# Patient Record
Sex: Male | Born: 1950 | Race: White | Hispanic: No | Smoking: Never smoker
Health system: Southern US, Community
[De-identification: ages and names within clinical notes are randomized; demographics above are authoritative.]

## PROBLEM LIST (undated history)

## (undated) DIAGNOSIS — T4145XA Adverse effect of unspecified anesthetic, initial encounter: Secondary | ICD-10-CM

## (undated) DIAGNOSIS — M1990 Unspecified osteoarthritis, unspecified site: Secondary | ICD-10-CM

## (undated) DIAGNOSIS — L570 Actinic keratosis: Secondary | ICD-10-CM

## (undated) DIAGNOSIS — T8859XA Other complications of anesthesia, initial encounter: Secondary | ICD-10-CM

## (undated) DIAGNOSIS — I739 Peripheral vascular disease, unspecified: Secondary | ICD-10-CM

## (undated) DIAGNOSIS — R809 Proteinuria, unspecified: Secondary | ICD-10-CM

## (undated) DIAGNOSIS — I10 Essential (primary) hypertension: Secondary | ICD-10-CM

## (undated) DIAGNOSIS — E119 Type 2 diabetes mellitus without complications: Secondary | ICD-10-CM

## (undated) DIAGNOSIS — N189 Chronic kidney disease, unspecified: Secondary | ICD-10-CM

## (undated) HISTORY — PX: UVULECTOMY: SHX2631

## (undated) HISTORY — PX: ROTATOR CUFF REPAIR: SHX139

## (undated) HISTORY — PX: PILONIDAL CYST EXCISION: SHX744

## (undated) HISTORY — PX: KNEE ARTHROSCOPY: SUR90

## (undated) HISTORY — PX: VEIN SURGERY: SHX48

## (undated) HISTORY — DX: Actinic keratosis: L57.0

## (undated) HISTORY — PX: TONSILLECTOMY: SUR1361

---
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
## 1988-12-13 HISTORY — PX: VASECTOMY: SHX75

## 1990-12-13 HISTORY — PX: VARICOSE VEIN SURGERY: SHX832

## 2004-10-24 ENCOUNTER — Emergency Department: Payer: Self-pay | Admitting: Emergency Medicine

## 2008-01-09 ENCOUNTER — Ambulatory Visit: Payer: Self-pay

## 2008-01-23 ENCOUNTER — Ambulatory Visit: Payer: Self-pay | Admitting: Orthopaedic Surgery

## 2008-01-23 ENCOUNTER — Other Ambulatory Visit: Payer: Self-pay

## 2008-01-30 ENCOUNTER — Ambulatory Visit: Payer: Self-pay | Admitting: Orthopaedic Surgery

## 2012-12-21 ENCOUNTER — Ambulatory Visit: Payer: Self-pay | Admitting: General Practice

## 2013-01-26 ENCOUNTER — Ambulatory Visit: Payer: Self-pay | Admitting: Unknown Physician Specialty

## 2013-01-29 ENCOUNTER — Ambulatory Visit: Payer: Self-pay | Admitting: Anesthesiology

## 2013-01-31 ENCOUNTER — Ambulatory Visit: Payer: Self-pay | Admitting: General Practice

## 2013-03-01 ENCOUNTER — Ambulatory Visit: Payer: Self-pay | Admitting: Internal Medicine

## 2015-04-04 NOTE — Op Note (Signed)
PATIENT NAME:  Lawrence Patterson, Lawrence Patterson MR#:  [id number] DATE OF BIRTH:  15-Feb-1951  DATE OF PROCEDURE:  01/31/2013  PREOPERATIVE DIAGNOSIS:  Internal derangement of the left knee.  POSTOPERATIVE DIAGNOSES: 1.  Tear of the posterior horn medial meniscus, left knee.  2.  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   PROCEDURES PERFORMED:  Left knee arthroscopy, partial medial meniscectomy and medial chondroplasty.   SURGEON:  Dr. Laurice Record. Hooten.   ANESTHESIA:  General.   ESTIMATED BLOOD LOSS:  Minimal.   FLUIDS REPLACED:  1000 mL of crystalloid.   DRAINS:  None.   INDICATIONS FOR SURGERY:  The patient is a 64 year old gentleman who has been seen for complaints of left knee pain.  MRI demonstrated findings suggestive of meniscal pathology.  After discussion of the risks and benefits of surgical intervention, the patient expressed understanding of the risks and benefits and agreed with plans for surgical intervention.   PROCEDURE IN DETAIL:  The patient was brought to the operating room, and after adequate general anesthesia was achieved, a tourniquet was placed on the patient's left thigh and leg was placed in a leg holder.  All bony prominences were well padded.  The patient's left knee and leg were cleaned and prepped with alcohol and DuraPrep draped in the usual sterile fashion.  A "timeout" was performed as per usual protocol.  The anticipated portal sites were injected with 0.25% Marcaine with epinephrine.  An anterolateral portal was created and a cannula was inserted.  Mild effusion was evacuated.  The scope was inserted and the knee was distended with fluid using the Stryker pump.  The scope was advanced down the medial gutter and into the medial compartment of the knee.  Under visualization with the scope, an anteromedial portal was created and hook probe was inserted.  Inspection of the medial compartment demonstrated a tear along the posterior horn of the medial meniscus.   This was debrided using meniscal punches and a 4.5 mm shaver.  Final contouring was performed using a 50 degree ArthroCare wand.  Good restoration of contour was appreciated.  The remaining rim of meniscus was visualized and probed and felt to be stable.  Inspection of the articular surface showed the tibial plateau to be in good condition.  There was an area along the lateral aspect of the medial femoral condyle with fissuring of the articular cartilage and lifting off of a portion of the articular cartilage.  These areas were debrided and contoured using the 50 degree ArthroCare wand.  Scope was then advanced into the intercondylar region.  Anterior cruciate ligament was visualized and probed and felt to stable.  Scope was removed from the anterolateral portal and reinserted via the anteromedial portal so as to better visualize the lateral compartment.  The lateral meniscus was visualized and probed and felt to be stable.  Only mild fraying of the innermost margin was appreciated and this was debrided using the ArthroCare wand.  The articular surface was visualized and probed and felt to stable.  Finally, the scope was positioned so as to visualize the patellofemoral articulation.  Good patellar tracking was noted.  There was a question of injury to the quadriceps tendon attachment on MRI.  Scope was positioned so as to visualize the quadriceps tendon especially along the superior pole.  No gross injury was appreciated.  The wound was irrigated with copious amounts of fluid and then suctioned dry.  The anterolateral portal was reapproximated using #3-0 nylon.  A combination  of 0.25% Marcaine with epinephrine and 4 mg of morphine was injected via the scope.  The scope was removed and the anteromedial portal was reapproximated using #3-0 nylon.  A sterile dressing was applied followed by application of ice wrap.    The patient tolerated the procedure well.  He was transported to the recovery room in stable  condition.     ____________________________ Laurice Record. Holley Bouche., MD jph:ea D: 01/31/2013 22:21:08 ET T: 02/01/2013 06:06:07 ET JOB#: [id number]  cc: Laurice Record. Holley Bouche., MD, <Dictator> JAMES P Holley Bouche MD ELECTRONICALLY SIGNED 02/04/2013 15:12

## 2016-07-16 ENCOUNTER — Other Ambulatory Visit: Payer: Self-pay | Admitting: Nephrology

## 2016-07-16 ENCOUNTER — Other Ambulatory Visit (HOSPITAL_COMMUNITY): Payer: Self-pay | Admitting: Nephrology

## 2016-07-16 DIAGNOSIS — S35411S Laceration of right renal artery, sequela: Secondary | ICD-10-CM

## 2016-07-29 ENCOUNTER — Other Ambulatory Visit: Payer: Self-pay | Admitting: Nephrology

## 2016-07-29 DIAGNOSIS — N2889 Other specified disorders of kidney and ureter: Secondary | ICD-10-CM

## 2016-07-30 ENCOUNTER — Ambulatory Visit
Admission: RE | Admit: 2016-07-30 | Discharge: 2016-07-30 | Disposition: A | Discharge status: Home / Self Care | Payer: BLUE CROSS/BLUE SHIELD | Source: Ambulatory Visit | Attending: Nephrology | Admitting: Nephrology

## 2016-07-30 DIAGNOSIS — N2889 Other specified disorders of kidney and ureter: Secondary | ICD-10-CM | POA: Diagnosis present

## 2016-10-11 DIAGNOSIS — M1A00X0 Idiopathic chronic gout, unspecified site, without tophus (tophi): Secondary | ICD-10-CM | POA: Insufficient documentation

## 2018-05-23 DIAGNOSIS — E1169 Type 2 diabetes mellitus with other specified complication: Secondary | ICD-10-CM | POA: Insufficient documentation

## 2018-09-14 ENCOUNTER — Other Ambulatory Visit: Payer: Self-pay | Admitting: Nephrology

## 2018-09-14 DIAGNOSIS — R809 Proteinuria, unspecified: Secondary | ICD-10-CM

## 2018-09-18 ENCOUNTER — Encounter
Admission: RE | Admit: 2018-09-18 | Discharge: 2018-09-18 | Disposition: A | Discharge status: Home / Self Care | Payer: Medicare Other | Source: Ambulatory Visit | Attending: Nephrology | Admitting: Nephrology

## 2018-09-18 ENCOUNTER — Other Ambulatory Visit: Payer: Self-pay | Admitting: Student

## 2018-09-18 DIAGNOSIS — Z01818 Encounter for other preprocedural examination: Secondary | ICD-10-CM

## 2018-09-18 DIAGNOSIS — Z7982 Long term (current) use of aspirin: Secondary | ICD-10-CM | POA: Diagnosis not present

## 2018-09-18 DIAGNOSIS — E1151 Type 2 diabetes mellitus with diabetic peripheral angiopathy without gangrene: Secondary | ICD-10-CM | POA: Diagnosis not present

## 2018-09-18 DIAGNOSIS — Z7984 Long term (current) use of oral hypoglycemic drugs: Secondary | ICD-10-CM | POA: Diagnosis not present

## 2018-09-18 DIAGNOSIS — Z9889 Other specified postprocedural states: Secondary | ICD-10-CM | POA: Diagnosis not present

## 2018-09-18 DIAGNOSIS — R809 Proteinuria, unspecified: Secondary | ICD-10-CM

## 2018-09-18 DIAGNOSIS — Z79899 Other long term (current) drug therapy: Secondary | ICD-10-CM | POA: Diagnosis not present

## 2018-09-18 DIAGNOSIS — I10 Essential (primary) hypertension: Secondary | ICD-10-CM | POA: Diagnosis not present

## 2018-09-18 LAB — DIFFERENTIAL
Basophils Absolute: 0.1 10*3/uL (ref 0–0.1)
Basophils Relative: 1 %
Eosinophils Absolute: 0.1 10*3/uL (ref 0–0.7)
Eosinophils Relative: 1 %
Lymphocytes Relative: 22 %
Lymphs Abs: 1.5 10*3/uL (ref 1.0–3.6)
MONO ABS: 0.6 10*3/uL (ref 0.2–1.0)
MONOS PCT: 9 %
NEUTROS ABS: 4.5 10*3/uL (ref 1.4–6.5)
Neutrophils Relative %: 67 %

## 2018-09-18 LAB — COMPREHENSIVE METABOLIC PANEL
ALT: 19 U/L (ref 0–44)
AST: 20 U/L (ref 15–41)
Albumin: 3.8 g/dL (ref 3.5–5.0)
Alkaline Phosphatase: 44 U/L (ref 38–126)
Anion gap: 6 (ref 5–15)
BUN: 29 mg/dL — AB (ref 8–23)
CHLORIDE: 107 mmol/L (ref 98–111)
CO2: 26 mmol/L (ref 22–32)
CREATININE: 1.14 mg/dL (ref 0.61–1.24)
Calcium: 9.1 mg/dL (ref 8.9–10.3)
GFR calc Af Amer: >60 mL/min (ref >60)
GFR calc non Af Amer: >60 mL/min (ref >60)
Glucose, Bld: 137 mg/dL — ABNORMAL HIGH (ref 70–99)
Potassium: 4.8 mmol/L (ref 3.5–5.1)
SODIUM: 139 mmol/L (ref 135–145)
Total Bilirubin: 0.6 mg/dL (ref 0.3–1.2)
Total Protein: 7.2 g/dL (ref 6.5–8.1)

## 2018-09-18 LAB — URINALYSIS, ROUTINE W REFLEX MICROSCOPIC
Bacteria, UA: NONE SEEN
Bilirubin Urine: NEGATIVE
GLUCOSE, UA: NEGATIVE mg/dL
Hgb urine dipstick: NEGATIVE
Ketones, ur: NEGATIVE mg/dL
Leukocytes, UA: NEGATIVE
NITRITE: NEGATIVE
PROTEIN: 100 mg/dL — AB
Specific Gravity, Urine: 1.024 (ref 1.005–1.030)
Squamous Epithelial / LPF: NONE SEEN (ref 0–5)
pH: 5 (ref 5.0–8.0)

## 2018-09-18 LAB — CBC
HCT: 41.5 % (ref 40.0–52.0)
Hemoglobin: 13.7 g/dL (ref 13.0–18.0)
MCH: 32.9 pg (ref 26.0–34.0)
MCHC: 33.1 g/dL (ref 32.0–36.0)
MCV: 99.5 fL (ref 80.0–100.0)
Platelets: 207 10*3/uL (ref 150–440)
RBC: 4.16 MIL/uL — ABNORMAL LOW (ref 4.40–5.90)
RDW: 14 % (ref 11.5–14.5)
WBC: 6.8 10*3/uL (ref 3.8–10.6)

## 2018-09-18 LAB — TYPE AND SCREEN
ABO/RH(D): O POS
Antibody Screen: NEGATIVE

## 2018-09-18 LAB — PROTIME-INR
INR: 0.95
Prothrombin Time: 12.6 seconds (ref 11.4–15.2)

## 2018-09-18 LAB — PROTEIN / CREATININE RATIO, URINE
Creatinine, Urine: 156 mg/dL
PROTEIN CREATININE RATIO: 1.87 mg/mg{creat} — AB (ref 0.00–0.15)
Total Protein, Urine: 291 mg/dL

## 2018-09-19 ENCOUNTER — Ambulatory Visit
Admission: RE | Admit: 2018-09-19 | Discharge: 2018-09-19 | Disposition: A | Discharge status: Home / Self Care | Payer: Medicare Other | Source: Ambulatory Visit | Attending: Nephrology | Admitting: Nephrology

## 2018-09-19 DIAGNOSIS — Z9889 Other specified postprocedural states: Secondary | ICD-10-CM | POA: Insufficient documentation

## 2018-09-19 DIAGNOSIS — I10 Essential (primary) hypertension: Secondary | ICD-10-CM | POA: Insufficient documentation

## 2018-09-19 DIAGNOSIS — Z79899 Other long term (current) drug therapy: Secondary | ICD-10-CM | POA: Insufficient documentation

## 2018-09-19 DIAGNOSIS — E1151 Type 2 diabetes mellitus with diabetic peripheral angiopathy without gangrene: Secondary | ICD-10-CM | POA: Insufficient documentation

## 2018-09-19 DIAGNOSIS — R809 Proteinuria, unspecified: Secondary | ICD-10-CM | POA: Diagnosis not present

## 2018-09-19 DIAGNOSIS — Z7984 Long term (current) use of oral hypoglycemic drugs: Secondary | ICD-10-CM | POA: Insufficient documentation

## 2018-09-19 DIAGNOSIS — Z7982 Long term (current) use of aspirin: Secondary | ICD-10-CM | POA: Insufficient documentation

## 2018-09-19 HISTORY — DX: Essential (primary) hypertension: I10

## 2018-09-19 HISTORY — DX: Peripheral vascular disease, unspecified: I73.9

## 2018-09-19 HISTORY — DX: Type 2 diabetes mellitus without complications: E11.9

## 2018-09-19 HISTORY — DX: Proteinuria, unspecified: R80.9

## 2018-09-19 LAB — GLUCOSE, CAPILLARY: Glucose-Capillary: 142 mg/dL — ABNORMAL HIGH (ref 70–99)

## 2018-09-19 MED ORDER — FENTANYL CITRATE (PF) 100 MCG/2ML IJ SOLN
INTRAMUSCULAR | Status: AC | PRN
  Administered 2018-09-19 (×2): 50 ug via INTRAVENOUS

## 2018-09-19 MED ORDER — FENTANYL CITRATE (PF) 100 MCG/2ML IJ SOLN
INTRAMUSCULAR | Status: AC
  Filled 2018-09-19: qty 4

## 2018-09-19 MED ORDER — SODIUM CHLORIDE 0.9 % IV SOLN
INTRAVENOUS | Status: DC
  Administered 2018-09-19: 10:00:00 via INTRAVENOUS

## 2018-09-19 MED ORDER — MIDAZOLAM HCL 2 MG/2ML IJ SOLN
INTRAMUSCULAR | Status: AC | PRN
  Administered 2018-09-19 (×2): 1 mg via INTRAVENOUS

## 2018-09-19 MED ORDER — MIDAZOLAM HCL 2 MG/2ML IJ SOLN
INTRAMUSCULAR | Status: AC
  Filled 2018-09-19: qty 4

## 2018-09-19 NOTE — Discharge Instructions (Signed)
Percutaneous Kidney Biopsy °A kidney biopsy is a procedure to remove small pieces of tissue from a kidney. In a percutaneous biopsy, the tissue is removed using a needle that is inserted through the skin. This procedure is done so that the tissue can be examined under a microscope and checked for disease or infection. °Tell a health care provider about: °· Any allergies you have. °· All medicines you are taking, including vitamins, herbs, eye drops, creams, and over-the-counter medicines. °· Any problems you or family members have had with anesthetic medicines. °· Any blood disorders you have. °· Any surgeries you have had. °· Any medical conditions you have. °· Whether you are pregnant or may be pregnant. °What are the risks? °Generally, this is a safe procedure. However, problems may occur, including: °· Infection. °· Bleeding. °· Allergic reactions to medicines. °· Damage to other structures or organs. °· Swelling from a collection of clotted blood outside a blood vessel (hematoma). °· Blood in the urine (hematuria). ° °What happens before the procedure? °· Follow instructions from your health care provider about eating or drinking restrictions. °· Ask your health care provider about: °? Changing or stopping your regular medicines. This is especially important if you are taking diabetes medicines or blood thinners. °? Taking medicines such as aspirin and ibuprofen. These medicines can thin your blood. Do not take these medicines before your procedure if your health care provider instructs you not to. °· You may be given antibiotic medicine to help prevent infection. °· You will have blood and urine samples taken. This is to make sure that you do not have a condition where you should not have a biopsy. °· Plan to have someone take you home from the hospital or clinic. °· Ask your health care provider how your biopsy site will be marked or identified. °What happens during the procedure? °· To lower your risk of  infection: °? Your health care team will wash or sanitize their hands. °? Your skin will be washed with soap. °· An IV tube will be inserted into one of your veins. °· You will be given one or more of the following: °? A medicine to help you relax (sedative). °? A medicine to numb the area (local anesthetic). °· You will lie on your abdomen. A firm pillow will be placed under your body to help push the kidneys closer to the surface of the skin. If you have a transplanted kidney, you will lie on your back. °· The health care provider will mark the area where the needle will enter your skin. °· An imaging test--such as an ultrasound, X-ray, CT scan, or MRI--will be used to locate the kidney. These images will also help the health care provider to guide the biopsy needle into the kidney. °· You will be asked to hold your breath and stay still while the health care provider inserts the needle and removes the kidney tissue. °? You will need to hold your breath and stay still for 30-45 seconds. °? During the biopsy, you may hear a popping sound from the needle. °? You may also feel some pressure from the area where the needle is being inserted. °· The needle may be inserted and removed 3 or 4 times to make sure that enough tissue is taken for testing. °· A bandage (dressing) may be placed over the spot where the needle entered your skin (biopsy site). °The procedure may vary among health care providers and hospitals. °What happens after the procedure? °·   Your blood pressure, heart rate, breathing rate, and blood oxygen level will be monitored until the medicines you were given have worn off. °· You will need to lie on your back for 6-8 hours. °· You may have some pain or soreness near the biopsy site. °· You may have pink or cloudy urine from small amounts of blood. This is normal. °· You may have grogginess or fatigue if you were given a sedative. °· Do not drive for 24 hours if you were given a sedative. °· It is up to  you to get the results of your procedure. Ask your health care provider, or the department performing the procedure, when your results will be ready. °This information is not intended to replace advice given to you by your health care provider. Make sure you discuss any questions you have with your health care provider. °Document Released: 10/09/2004 Document Revised: 09/10/2016 Document Reviewed: 09/10/2016 °Elsevier Interactive Patient Education © 2018 [employer] ° °

## 2018-09-19 NOTE — Procedures (Signed)
L renal random core Bx 16 g times 2 EBL 0 Comp 0

## 2018-09-19 NOTE — H&P (Signed)
Chief Complaint: Patient was seen in consultation today for No chief complaint on file.  at the request of Kolluru,Sarath  Referring Physician(s): Kolluru,Sarath  Supervising Physician: Marybelle Killings  Patient Status: [facility] - Out-pt  History of Present Illness: Lawrence Patterson is a 67 y.o. male with proteinuria referred for renal biopsy.  Past Medical History:  Diagnosis Date  . Diabetes mellitus ([place])   . Hypertension   . Proteinuria   . PVD (peripheral vascular disease) ([place])     Past Surgical History:  Procedure Laterality Date  . ROTATOR CUFF REPAIR Right   . TONSILLECTOMY      Allergies: Patient has no known allergies.  Medications: Prior to Admission medications   Medication Sig Start Date End Date Taking? Authorizing Provider  allopurinol (ZYLOPRIM) 300 MG tablet Take 300 mg by mouth daily.   Yes [provider]  amLODipine (NORVASC) 10 MG tablet Take 10 mg by mouth daily.   Yes [provider]  aspirin 81 MG chewable tablet Chew 81 mg by mouth daily.   Yes [provider]  cholecalciferol (VITAMIN D) 400 units TABS tablet Take 2,000 Units by mouth daily.   Yes [provider]  co-enzyme Q-10 30 MG capsule Take 200 mg by mouth daily.   Yes [provider]  doxazosin (CARDURA) 4 MG tablet Take 4 mg by mouth 2 (two) times daily.   Yes [provider]  glucosamine-chondroitin 500-400 MG tablet Take 1 tablet by mouth 3 (three) times daily.   Yes [provider]  latanoprost (XALATAN) 0.005 % ophthalmic solution 1 drop at bedtime.   Yes [provider]  magnesium 30 MG tablet Take 250 mg by mouth daily.   Yes [provider]  metFORMIN (GLUCOPHAGE) 500 MG tablet Take by mouth 2 (two) times daily with a meal.   Yes [provider]  Multiple Vitamin (MULTIVITAMIN) capsule Take 1 capsule by mouth daily.   Yes [provider]  quinapril (ACCUPRIL) 40 MG tablet Take 40 mg  by mouth at bedtime.   Yes [provider]  tadalafil (ADCIRCA/CIALIS) 20 MG tablet Take 20 mg by mouth daily as needed for erectile dysfunction.   Yes [provider]     History reviewed. No pertinent family history.  Social History   Socioeconomic History  . Marital status: Married    Spouse name: Not on file  . Number of children: Not on file  . Years of education: Not on file  . Highest education level: Not on file  Occupational History  . Not on file  Social Needs  . Financial resource strain: Not on file  . Food insecurity:    Worry: Not on file    Inability: Not on file  . Transportation needs:    Medical: Not on file    Non-medical: Not on file  Tobacco Use  . Smoking status: Never Smoker  . Smokeless tobacco: Current User  Substance and Sexual Activity  . Alcohol use: Never    Frequency: Never  . Drug use: Never  . Sexual activity: Yes  Lifestyle  . Physical activity:    Days per week: Not on file    Minutes per session: Not on file  . Stress: Not on file  Relationships  . Social connections:    Talks on phone: Not on file    Gets together: Not on file    Attends religious service: Not on file    Active member of club or  organization: Not on file    Attends meetings of clubs or organizations: Not on file    Relationship status: Not on file  Other Topics Concern  . Not on file  Social History Narrative  . Not on file    ECOG Status: 0 - Asymptomatic  Review of Systems: A 12 point ROS discussed and pertinent positives are indicated in the HPI above.  All other systems are negative.  Review of Systems  No CP/F/C No SOB No MI/Stroke Positive Hx of BLE venous insufficiency and B vein stripping.    Vital Signs: BP (!) 142/86   Pulse 60   Temp 98.6 F (37 C) (Oral)   Resp 15   Ht 5\' 10"  (1.778 m)   Wt 101.2 kg   SpO2 96%   BMI 32.00 kg/m   Physical Exam  Constitutional: He is oriented to person, place, and time. He  appears well-developed and well-nourished.  HENT:  Head: Normocephalic and atraumatic.  Cardiovascular: Normal rate and regular rhythm.  Pulmonary/Chest: Effort normal and breath sounds normal.  Neurological: He is alert and oriented to person, place, and time.    Imaging: No results found.  Labs:  CBC: Recent Labs    09/18/18 0819  WBC 6.8  HGB 13.7  HCT 41.5  PLT 207    COAGS: Recent Labs    09/18/18 0819  INR 0.95    BMP: Recent Labs    09/18/18 0819  NA 139  K 4.8  CL 107  CO2 26  GLUCOSE 137*  BUN 29*  CALCIUM 9.1  CREATININE 1.14  GFRNONAA >60  GFRAA >60    LIVER FUNCTION TESTS: Recent Labs    09/18/18 0819  BILITOT 0.6  AST 20  ALT 19  ALKPHOS 44  PROT 7.2  ALBUMIN 3.8    TUMOR MARKERS: No results for input(s): AFPTM, CEA, CA199, CHROMGRNA in the last 8760 hours.  Assessment and Plan:  Proteinuria. Renal Core biopsy to follow.  Thank you for this interesting consult.  I greatly enjoyed meeting Lawrence Patterson and look forward to participating in their care.  A copy of this report was sent to the requesting provider on this date.  Electronically Signed: Art A Shinichi Anguiano, MD 09/19/2018, 9:32 AM   I spent a total of  40 Minutes   in face to face in clinical consultation, greater than 50% of which was counseling/coordinating care for renal biopsy.

## 2018-09-27 ENCOUNTER — Encounter: Payer: Self-pay | Admitting: Nephrology

## 2018-09-27 LAB — SURGICAL PATHOLOGY

## 2018-10-03 DIAGNOSIS — E1121 Type 2 diabetes mellitus with diabetic nephropathy: Secondary | ICD-10-CM | POA: Insufficient documentation

## 2019-02-06 DIAGNOSIS — E119 Type 2 diabetes mellitus without complications: Secondary | ICD-10-CM | POA: Diagnosis not present

## 2019-02-06 DIAGNOSIS — I10 Essential (primary) hypertension: Secondary | ICD-10-CM | POA: Insufficient documentation

## 2019-02-06 DIAGNOSIS — Z79899 Other long term (current) drug therapy: Secondary | ICD-10-CM | POA: Diagnosis not present

## 2019-02-06 DIAGNOSIS — M25561 Pain in right knee: Secondary | ICD-10-CM | POA: Insufficient documentation

## 2019-02-06 DIAGNOSIS — Z7982 Long term (current) use of aspirin: Secondary | ICD-10-CM | POA: Diagnosis not present

## 2019-02-06 DIAGNOSIS — Z7984 Long term (current) use of oral hypoglycemic drugs: Secondary | ICD-10-CM | POA: Diagnosis not present

## 2019-02-07 ENCOUNTER — Other Ambulatory Visit: Payer: Self-pay

## 2019-02-07 ENCOUNTER — Emergency Department
Admission: EM | Admit: 2019-02-07 | Discharge: 2019-02-07 | Disposition: A | Discharge status: Home / Self Care | Payer: Medicare Other | Attending: Emergency Medicine | Admitting: Emergency Medicine

## 2019-02-07 ENCOUNTER — Encounter: Payer: Self-pay | Admitting: Emergency Medicine

## 2019-02-07 DIAGNOSIS — M25561 Pain in right knee: Secondary | ICD-10-CM | POA: Diagnosis not present

## 2019-02-07 LAB — COMPREHENSIVE METABOLIC PANEL
ALK PHOS: 56 U/L (ref 38–126)
ALT: 22 U/L (ref 0–44)
ANION GAP: 8 (ref 5–15)
AST: 21 U/L (ref 15–41)
Albumin: 4 g/dL (ref 3.5–5.0)
BUN: 32 mg/dL — ABNORMAL HIGH (ref 8–23)
CO2: 24 mmol/L (ref 22–32)
Calcium: 9.1 mg/dL (ref 8.9–10.3)
Chloride: 107 mmol/L (ref 98–111)
Creatinine, Ser: 1.33 mg/dL — ABNORMAL HIGH (ref 0.61–1.24)
GFR, EST NON AFRICAN AMERICAN: 55 mL/min — AB (ref >60)
GLUCOSE: 184 mg/dL — AB (ref 70–99)
Potassium: 5 mmol/L (ref 3.5–5.1)
Sodium: 139 mmol/L (ref 135–145)
Total Bilirubin: 0.4 mg/dL (ref 0.3–1.2)
Total Protein: 7.4 g/dL (ref 6.5–8.1)

## 2019-02-07 LAB — CBC WITH DIFFERENTIAL/PLATELET
Abs Immature Granulocytes: 0.06 10*3/uL (ref 0.00–0.07)
BASOS PCT: 0 %
Basophils Absolute: 0 10*3/uL (ref 0.0–0.1)
EOS ABS: 0.1 10*3/uL (ref 0.0–0.5)
Eosinophils Relative: 1 %
HCT: 40.9 % (ref 39.0–52.0)
Hemoglobin: 13.4 g/dL (ref 13.0–17.0)
IMMATURE GRANULOCYTES: 1 %
Lymphocytes Relative: 10 %
Lymphs Abs: 1.2 10*3/uL (ref 0.7–4.0)
MCH: 31.7 pg (ref 26.0–34.0)
MCHC: 32.8 g/dL (ref 30.0–36.0)
MCV: 96.7 fL (ref 80.0–100.0)
MONOS PCT: 8 %
Monocytes Absolute: 1 10*3/uL (ref 0.1–1.0)
NEUTROS PCT: 80 %
Neutro Abs: 10 10*3/uL — ABNORMAL HIGH (ref 1.7–7.7)
PLATELETS: 251 10*3/uL (ref 150–400)
RBC: 4.23 MIL/uL (ref 4.22–5.81)
RDW: 13 % (ref 11.5–15.5)
WBC: 12.5 10*3/uL — AB (ref 4.0–10.5)
nRBC: 0 % (ref 0.0–0.2)

## 2019-02-07 LAB — SEDIMENTATION RATE: Sed Rate: 52 mm/hr — ABNORMAL HIGH (ref 0–20)

## 2019-02-07 LAB — LACTIC ACID, PLASMA: Lactic Acid, Venous: 1.1 mmol/L (ref 0.5–1.9)

## 2019-02-07 MED ORDER — OXYCODONE-ACETAMINOPHEN 5-325 MG PO TABS
1.0000 | ORAL_TABLET | ORAL | Status: DC | PRN
  Administered 2019-02-07: 1 via ORAL
  Filled 2019-02-07: qty 1

## 2019-02-07 MED ORDER — BUPIVACAINE HCL 0.25 % IJ SOLN
5.0000 mL | Freq: Once | INTRAMUSCULAR | Status: AC
  Administered 2019-02-07: 5 mL
  Filled 2019-02-07: qty 5

## 2019-02-07 MED ORDER — OXYCODONE-ACETAMINOPHEN 5-325 MG PO TABS
2.0000 | ORAL_TABLET | Freq: Once | ORAL | Status: AC
  Administered 2019-02-07: 2 via ORAL
  Filled 2019-02-07: qty 2

## 2019-02-07 MED ORDER — LIDOCAINE HCL (PF) 1 % IJ SOLN
5.0000 mL | Freq: Once | INTRAMUSCULAR | Status: AC
  Administered 2019-02-07: 5 mL
  Filled 2019-02-07: qty 5

## 2019-02-07 MED ORDER — FENTANYL CITRATE (PF) 100 MCG/2ML IJ SOLN
50.0000 ug | INTRAMUSCULAR | Status: DC | PRN
  Administered 2019-02-07: 50 ug via NASAL
  Filled 2019-02-07: qty 2

## 2019-02-07 NOTE — ED Notes (Signed)
MD Owens Shark at bedside to perform procedure. Mayra, NT at bedside with MD currently.

## 2019-02-07 NOTE — ED Provider Notes (Signed)
[facility] Emergency Department Provider Note ____________   First MD Initiated Contact with Patient 02/07/19 [phone]     (approximate)  I have reviewed the triage vital signs and the nursing notes.   HISTORY  Chief Complaint Knee Pain   HPI Lawrence Patterson is a 68 y.o. male with below list of chronic medical conditions including recent evaluation for right knee pain for which the patient received Synvisc on Monday via Rogers Blocker PA presents to the emergency department with worsening right knee pain which began Monday evening worse with any movement of the knee.  Patient denies any fever  Past Medical History:  Diagnosis Date  . Diabetes mellitus ([place])   . Hypertension   . Proteinuria   . PVD (peripheral vascular disease) ([place])     There are no active problems to display for this patient.   Past Surgical History:  Procedure Laterality Date  . ROTATOR CUFF REPAIR Right   . TONSILLECTOMY      Prior to Admission medications   Medication Sig Start Date End Date Taking? Authorizing Provider  allopurinol (ZYLOPRIM) 300 MG tablet Take 300 mg by mouth daily.    [provider]  amLODipine (NORVASC) 10 MG tablet Take 10 mg by mouth daily.    [provider]  aspirin 81 MG chewable tablet Chew 81 mg by mouth daily.    [provider]  cholecalciferol (VITAMIN D) 400 units TABS tablet Take 2,000 Units by mouth daily.    [provider]  co-enzyme Q-10 30 MG capsule Take 200 mg by mouth daily.    [provider]  doxazosin (CARDURA) 4 MG tablet Take 4 mg by mouth 2 (two) times daily.    [provider]  glucosamine-chondroitin 500-400 MG tablet Take 1 tablet by mouth 3 (three) times daily.    [provider]  latanoprost (XALATAN) 0.005 % ophthalmic solution 1 drop at bedtime.    [provider]  magnesium 30 MG tablet Take 250 mg by mouth daily.    [provider]  metFORMIN  (GLUCOPHAGE) 500 MG tablet Take by mouth 2 (two) times daily with a meal.    [provider]  Multiple Vitamin (MULTIVITAMIN) capsule Take 1 capsule by mouth daily.    [provider]  quinapril (ACCUPRIL) 40 MG tablet Take 40 mg by mouth at bedtime.    [provider]  tadalafil (ADCIRCA/CIALIS) 20 MG tablet Take 20 mg by mouth daily as needed for erectile dysfunction.    [provider]    Allergies Patient has no known allergies.  No family history on file.  Social History Social History   Tobacco Use  . Smoking status: Never Smoker  . Smokeless tobacco: Current User  Substance Use Topics  . Alcohol use: Never    Frequency: Never  . Drug use: Never    Review of Systems Constitutional: No fever/chills Eyes: No visual changes. ENT: No sore throat. Cardiovascular: Denies chest pain. Respiratory: Denies shortness of breath. Gastrointestinal: No abdominal pain.  No nausea, no vomiting.  No diarrhea.  No constipation. Genitourinary: Negative for dysuria. Musculoskeletal: Negative for neck pain.  Negative for back pain.  Positive for right knee pain Integumentary: Negative for rash. Neurological: Negative for headaches, focal weakness or numbness.  ____________________________________________   PHYSICAL EXAM:  VITAL SIGNS: ED Triage Vitals  Enc Vitals Group     BP 02/06/19 2358 (!) 147/83     Pulse Rate 02/06/19 2358 86  Resp 02/06/19 2358 16     Temp 02/06/19 2358 99.1 F (37.3 C)     Temp Source 02/06/19 2358 Oral     SpO2 02/06/19 2358 95 %     Weight 02/06/19 2359 103.4 kg (228 lb)     Height 02/06/19 2359 1.778 m (5\' 10" )     Head Circumference --      Peak Flow --      Pain Score 02/07/19 0016 10     Pain Loc --      Pain Edu? --      Excl. in [place]? --     Constitutional: Alert and oriented.  Apparent discomfort  Cardiovascular: Normal rate, regular rhythm. Good peripheral circulation. Grossly normal heart  sounds. Respiratory: Normal respiratory effort.  No retractions. Lungs CTAB. Musculoskeletal: No abnormal skin findings noted on the right knee.  Right knee tenderness to palpation.  Pain with range of motion.  Swelling noted right upper lateral region of the knee without ballottement Neurologic:  Normal speech and language. No gross focal neurologic deficits are appreciated.  Skin:  Skin is warm, dry and intact. No rash noted. Psychiatric: Mood and affect are normal. Speech and behavior are normal.  ____________________________________________   LABS (all labs ordered are listed, but only abnormal results are displayed)  Labs Reviewed  COMPREHENSIVE METABOLIC PANEL - Abnormal; Notable for the following components:      Result Value   Glucose, Bld 184 (*)    BUN 32 (*)    Creatinine, Ser 1.33 (*)    GFR calc non Af Amer 55 (*)    All other components within normal limits  CBC WITH DIFFERENTIAL/PLATELET - Abnormal; Notable for the following components:   WBC 12.5 (*)    Neutro Abs 10.0 (*)    All other components within normal limits  SEDIMENTATION RATE - Abnormal; Notable for the following components:   Sed Rate 52 (*)    All other components within normal limits  LACTIC ACID, PLASMA   ____________________  .Joint Aspiration/Arthrocentesis Date/Time: 02/07/2019 10:37 PM Performed by: Gregor Hams, MD Authorized by: Gregor Hams, MD   Consent:    Consent obtained:  Verbal   Consent given by:  Patient   Risks discussed:  Pain, bleeding and incomplete drainage   Alternatives discussed:  Alternative treatment Location:    Location:  Knee   Knee:  R knee Anesthesia (see MAR for exact dosages):    Anesthesia method:  Topical application and local infiltration (Marcaine 5 mL's)   Local anesthetic:  Lidocaine 1% w/o epi Procedure details:    Preparation: Patient was prepped and draped in usual sterile fashion     Needle gauge:  18 G   Ultrasound guidance: no      Approach:  Lateral   Aspirate characteristics:  Blood-tinged   Steroid injected: no     Specimen collected: no   Post-procedure details:    Patient tolerance of procedure:  Tolerated well, no immediate complications     ____________________________________________   INITIAL IMPRESSION / MDM / ASSESSMENT AND PLAN / ED COURSE  As part of my medical decision making, I reviewed the following data within the [place] NUMBER   68 year old male presented with above-stated history and physical exam secondary to right knee pain following Synvisc infection.  Suspect adverse sequelae to Synvisc injection as the etiology for the patient's discomfort.  Knee aspiration performed under sterile technique however no fluid obtained.  10 mL's of Marcaine/lidocaine  1-1 solution injected with improvement of pain.  Patient discussed with Dr. Marry Guan who agreed with treatment plan with recommendation to follow-up in clinic today. ____________________________________________  FINAL CLINICAL IMPRESSION(S) / ED DIAGNOSES  Final diagnoses:  Acute pain of right knee     MEDICATIONS GIVEN DURING THIS VISIT:  Medications  oxyCODONE-acetaminophen (PERCOCET/ROXICET) 5-325 MG per tablet 1 tablet (1 tablet Oral Given 02/07/19 0019)  fentaNYL (SUBLIMAZE) injection 50 mcg (50 mcg Nasal Given 02/07/19 0143)  bupivacaine (MARCAINE) 0.25 % (with pres) injection 5 mL (has no administration in time range)  lidocaine (PF) (XYLOCAINE) 1 % injection 5 mL (has no administration in time range)  oxyCODONE-acetaminophen (PERCOCET/ROXICET) 5-325 MG per tablet 2 tablet (2 tablets Oral Given 02/07/19 0335)     ED Discharge Orders    None       Note:  This document was prepared using Dragon voice recognition software and may include unintentional dictation errors.   Gregor Hams, MD 02/07/19 2238

## 2019-02-07 NOTE — ED Triage Notes (Signed)
Patient to ER for c/o swelling to right knee. Patient states he had large amount of fluid drawn off knee on Monday in Dr. Clydell Hakim office (saw Dr. Rogers Blocker). Had Synvisc injection same day. Called office today and was told they believed he may be having reaction to injection. States now, pain is too severe and he is unable to bend knee to walk. +Warmth at knee as well. Had Tylenol at approx 2000, Tramadol at 2015 with no relief.

## 2019-02-20 ENCOUNTER — Other Ambulatory Visit: Payer: Self-pay | Admitting: Physician Assistant

## 2019-02-20 DIAGNOSIS — G8929 Other chronic pain: Secondary | ICD-10-CM

## 2019-02-20 DIAGNOSIS — M25561 Pain in right knee: Principal | ICD-10-CM

## 2019-02-28 ENCOUNTER — Ambulatory Visit
Admission: RE | Admit: 2019-02-28 | Discharge: 2019-02-28 | Disposition: A | Discharge status: Home / Self Care | Payer: Medicare Other | Source: Ambulatory Visit | Attending: Physician Assistant | Admitting: Physician Assistant

## 2019-02-28 ENCOUNTER — Other Ambulatory Visit: Payer: Self-pay

## 2019-02-28 DIAGNOSIS — M25561 Pain in right knee: Secondary | ICD-10-CM | POA: Diagnosis present

## 2019-02-28 DIAGNOSIS — G8929 Other chronic pain: Secondary | ICD-10-CM | POA: Insufficient documentation

## 2019-03-11 DIAGNOSIS — M1711 Unilateral primary osteoarthritis, right knee: Secondary | ICD-10-CM | POA: Insufficient documentation

## 2019-05-02 NOTE — Discharge Instructions (Signed)
°  Instructions after Total Knee Replacement ° ° Lawrence Patterson, Jr., M.D.    ° Dept. of Orthopaedics & Sports Medicine ° [facility] ° [address] ° [place], [place]  [address] ° Phone: [phone]   Fax: [phone] ° °  °DIET: °• Drink plenty of non-alcoholic fluids. °• Resume your normal diet. Include foods high in fiber. ° °ACTIVITY:  °• You may use crutches or a walker with weight-bearing as tolerated, unless instructed otherwise. °• You may be weaned off of the walker or crutches by your Physical Therapist.  °• Do NOT place pillows under the knee. Anything placed under the knee could limit your ability to straighten the knee.   °• Continue doing gentle exercises. Exercising will reduce the pain and swelling, increase motion, and prevent muscle weakness.   °• Please continue to use the TED compression stockings for 6 weeks. You may remove the stockings at night, but should reapply them in the morning. °• Do not drive or operate any equipment until instructed. ° °WOUND CARE:  °• Continue to use the PolarCare or ice packs periodically to reduce pain and swelling. °• You may bathe or shower after the staples are removed at the first office visit following surgery. ° °MEDICATIONS: °• You may resume your regular medications. °• Please take the pain medication as prescribed on the medication. °• Do not take pain medication on an empty stomach. °• You have been given a prescription for a blood thinner (Lovenox or Coumadin). Please take the medication as instructed. (NOTE: After completing a 2 week course of Lovenox, take one Enteric-coated aspirin once a day. This along with elevation will help reduce the possibility of phlebitis in your operated leg.) °• Do not drive or drink alcoholic beverages when taking pain medications. ° °CALL THE OFFICE FOR: °• Temperature above 101 degrees °• Excessive bleeding or drainage on the dressing. °• Excessive swelling, coldness, or paleness of the toes. °• Persistent  nausea and vomiting. ° °FOLLOW-UP:  °• You should have an appointment to return to the office in 10-14 days after surgery. °• Arrangements have been made for continuation of Physical Therapy (either home therapy or outpatient therapy). °  °

## 2019-05-03 ENCOUNTER — Encounter
Admission: RE | Admit: 2019-05-03 | Discharge: 2019-05-03 | Disposition: A | Discharge status: Home / Self Care | Payer: Medicare Other | Source: Ambulatory Visit | Attending: Orthopedic Surgery | Admitting: Orthopedic Surgery

## 2019-05-03 ENCOUNTER — Other Ambulatory Visit: Payer: Self-pay

## 2019-05-03 DIAGNOSIS — Z1159 Encounter for screening for other viral diseases: Secondary | ICD-10-CM | POA: Insufficient documentation

## 2019-05-03 DIAGNOSIS — Z01818 Encounter for other preprocedural examination: Secondary | ICD-10-CM | POA: Diagnosis not present

## 2019-05-03 DIAGNOSIS — R001 Bradycardia, unspecified: Secondary | ICD-10-CM | POA: Diagnosis not present

## 2019-05-03 DIAGNOSIS — I10 Essential (primary) hypertension: Secondary | ICD-10-CM | POA: Diagnosis not present

## 2019-05-03 HISTORY — DX: Other complications of anesthesia, initial encounter: T88.59XA

## 2019-05-03 HISTORY — DX: Chronic kidney disease, unspecified: N18.9

## 2019-05-03 HISTORY — DX: Unspecified osteoarthritis, unspecified site: M19.90

## 2019-05-03 LAB — URINALYSIS, ROUTINE W REFLEX MICROSCOPIC
Bacteria, UA: NONE SEEN
Bilirubin Urine: NEGATIVE
Glucose, UA: NEGATIVE mg/dL
Hgb urine dipstick: NEGATIVE
Ketones, ur: NEGATIVE mg/dL
Leukocytes,Ua: NEGATIVE
Nitrite: NEGATIVE
Protein, ur: 100 mg/dL — AB
Specific Gravity, Urine: 1.016 (ref 1.005–1.030)
Squamous Epithelial / HPF: NONE SEEN (ref 0–5)
pH: 5 (ref 5.0–8.0)

## 2019-05-03 LAB — COMPREHENSIVE METABOLIC PANEL
ALT: 23 U/L (ref 0–44)
AST: 26 U/L (ref 15–41)
Albumin: 4 g/dL (ref 3.5–5.0)
Alkaline Phosphatase: 49 U/L (ref 38–126)
Anion gap: 8 (ref 5–15)
BUN: 26 mg/dL — ABNORMAL HIGH (ref 8–23)
CO2: 25 mmol/L (ref 22–32)
Calcium: 9.6 mg/dL (ref 8.9–10.3)
Chloride: 106 mmol/L (ref 98–111)
Creatinine, Ser: 0.9 mg/dL (ref 0.61–1.24)
GFR calc Af Amer: >60 mL/min (ref >60)
GFR calc non Af Amer: >60 mL/min (ref >60)
Glucose, Bld: 124 mg/dL — ABNORMAL HIGH (ref 70–99)
Potassium: 5.2 mmol/L — ABNORMAL HIGH (ref 3.5–5.1)
Sodium: 139 mmol/L (ref 135–145)
Total Bilirubin: 0.7 mg/dL (ref 0.3–1.2)
Total Protein: 7 g/dL (ref 6.5–8.1)

## 2019-05-03 LAB — TYPE AND SCREEN
ABO/RH(D): O POS
Antibody Screen: NEGATIVE

## 2019-05-03 LAB — CBC
HCT: 41.4 % (ref 39.0–52.0)
Hemoglobin: 13.8 g/dL (ref 13.0–17.0)
MCH: 32.4 pg (ref 26.0–34.0)
MCHC: 33.3 g/dL (ref 30.0–36.0)
MCV: 97.2 fL (ref 80.0–100.0)
Platelets: 236 10*3/uL (ref 150–400)
RBC: 4.26 MIL/uL (ref 4.22–5.81)
RDW: 12.8 % (ref 11.5–15.5)
WBC: 6.7 10*3/uL (ref 4.0–10.5)
nRBC: 0 % (ref 0.0–0.2)

## 2019-05-03 LAB — PROTIME-INR
INR: 1 (ref 0.8–1.2)
Prothrombin Time: 12.8 seconds (ref 11.4–15.2)

## 2019-05-03 LAB — C-REACTIVE PROTEIN: CRP: 0.9 mg/dL (ref <1.0)

## 2019-05-03 LAB — APTT: aPTT: 28 seconds (ref 24–36)

## 2019-05-03 LAB — HEMOGLOBIN A1C
Hgb A1c MFr Bld: 6.2 % — ABNORMAL HIGH (ref 4.8–5.6)
Mean Plasma Glucose: 131.24 mg/dL

## 2019-05-03 LAB — SURGICAL PCR SCREEN
MRSA, PCR: NEGATIVE
Staphylococcus aureus: POSITIVE — AB

## 2019-05-03 LAB — SEDIMENTATION RATE: Sed Rate: 21 mm/hr — ABNORMAL HIGH (ref 0–20)

## 2019-05-03 NOTE — Patient Instructions (Addendum)
  Your procedure is scheduled on: Mon. 5/25 Report to Day Surgery.2nd floor [facility] To find out your arrival time please call [phone] between 1PM - 3PM on Tomorrow.  Remember: Instructions that are not followed completely may result in serious medical risk, up to and including death, or upon the discretion of your surgeon and anesthesiologist your surgery may need to be rescheduled.   _x___ 1. Do not eat food or drink liquids after midnight. No gum chewing or hard candies. Three hours prior to surgery finish drinking G2 drink then  nothing by mouth until surgery.   _x___ 2. No Alcohol for 24 hours before or after surgery.   ____ 3. Bring all medications with you on the day of surgery if instructed.    ____ 4. Notify your doctor if there is any change in your medical condition     (cold, fever, infections).  __x__5. No tobacco products for 24 hours before      Do not wear jewelry, make-up, hairpins, clips or nail polish.  Do not wear lotions, powders, or perfumes. You may wear deodorant.  Do not shave 48 hours prior to surgery. Men may shave face and neck.  Do not bring valuables to the hospital.    [facility] is not responsible for any belongings or valuables.               Contacts, dentures or bridgework may not be worn into surgery.           bring suitcase with you. After surgery it may be brought to your room.  For patients admitted to the hospital, discharge time is determined by your treatment team.   Patients discharged the day of surgery will not be allowed to drive home.   Please read over the following fact sheets that you were given:   _x__ Take these medicines the morning of surgery with A SIP OF WATER:    1. allopurinol (ZYLOPRIM) 300 MG tablet  2. amLODipine (NORVASC) 10 MG tablet  3. carvedilol (COREG) 25 MG tablet  4.latanoprost (XALATAN) 0.005 % ophthalmic solution  5.  6.  ____ Fleet Enema (as directed)   __x__ Use CHG Soap as directed  ____  Use inhalers on the day of surgery  ____ Stop metformin 2 days prior to surgery    ____ Take 1/2 of usual insulin dose the night before surgery and none on the morning of surgery.   _x___ Stopped aspirin yesterday  __x__ Stop Anti-inflammatories on May take tylenol   __x__ Stop supplements until after surgery. Coenzyme Q10 (COQ10) 200 MG CAPS,glucosamine-chondroitin 500-400 MG tablet    ____ Bring C-Pap to the hospital.

## 2019-05-04 LAB — URINE CULTURE
Culture: NO GROWTH
Special Requests: NORMAL

## 2019-05-05 LAB — NOVEL CORONAVIRUS, NAA (HOSP ORDER, SEND-OUT TO REF LAB; TAT 18-24 HRS): SARS-CoV-2, NAA: NOT DETECTED

## 2019-05-06 MED ORDER — TRANEXAMIC ACID-NACL 1000-0.7 MG/100ML-% IV SOLN
1000.0000 mg | INTRAVENOUS | Status: DC
  Filled 2019-05-06: qty 100

## 2019-05-07 ENCOUNTER — Other Ambulatory Visit: Payer: Self-pay

## 2019-05-07 ENCOUNTER — Encounter
Admission: RE | Disposition: A | Discharge status: Home Health | Payer: Self-pay | Source: Home / Self Care | Attending: Orthopedic Surgery

## 2019-05-07 ENCOUNTER — Encounter: Payer: Self-pay | Admitting: Orthopedic Surgery

## 2019-05-07 ENCOUNTER — Inpatient Hospital Stay: Payer: Medicare Other

## 2019-05-07 ENCOUNTER — Inpatient Hospital Stay: Payer: Medicare Other | Admitting: Certified Registered Nurse Anesthetist

## 2019-05-07 ENCOUNTER — Inpatient Hospital Stay
Admission: RE | Admit: 2019-05-07 | Discharge: 2019-05-09 | DRG: 470 | Disposition: A | Discharge status: Home Health | Payer: Medicare Other | Attending: Orthopedic Surgery | Admitting: Orthopedic Surgery

## 2019-05-07 DIAGNOSIS — E1169 Type 2 diabetes mellitus with other specified complication: Secondary | ICD-10-CM | POA: Diagnosis present

## 2019-05-07 DIAGNOSIS — L409 Psoriasis, unspecified: Secondary | ICD-10-CM | POA: Insufficient documentation

## 2019-05-07 DIAGNOSIS — I10 Essential (primary) hypertension: Secondary | ICD-10-CM | POA: Insufficient documentation

## 2019-05-07 DIAGNOSIS — M1711 Unilateral primary osteoarthritis, right knee: Principal | ICD-10-CM | POA: Diagnosis present

## 2019-05-07 DIAGNOSIS — E785 Hyperlipidemia, unspecified: Secondary | ICD-10-CM | POA: Diagnosis present

## 2019-05-07 DIAGNOSIS — Z96659 Presence of unspecified artificial knee joint: Secondary | ICD-10-CM

## 2019-05-07 DIAGNOSIS — E7800 Pure hypercholesterolemia, unspecified: Secondary | ICD-10-CM | POA: Insufficient documentation

## 2019-05-07 DIAGNOSIS — Z1159 Encounter for screening for other viral diseases: Secondary | ICD-10-CM | POA: Diagnosis not present

## 2019-05-07 HISTORY — PX: KNEE ARTHROPLASTY: SHX992

## 2019-05-07 LAB — GLUCOSE, CAPILLARY
Glucose-Capillary: 151 mg/dL — ABNORMAL HIGH (ref 70–99)
Glucose-Capillary: 146 mg/dL — ABNORMAL HIGH (ref 70–99)
Glucose-Capillary: 155 mg/dL — ABNORMAL HIGH (ref 70–99)
Glucose-Capillary: 162 mg/dL — ABNORMAL HIGH (ref 70–99)

## 2019-05-07 LAB — POCT I-STAT 4, (NA,K, GLUC, HGB,HCT)
Glucose, Bld: 143 mg/dL — ABNORMAL HIGH (ref 70–99)
HCT: 42 % (ref 39.0–52.0)
Hemoglobin: 14.3 g/dL (ref 13.0–17.0)
Potassium: 4.1 mmol/L (ref 3.5–5.1)
Sodium: 140 mmol/L (ref 135–145)

## 2019-05-07 SURGERY — ARTHROPLASTY, KNEE, TOTAL, USING IMAGELESS COMPUTER-ASSISTED NAVIGATION
Anesthesia: Spinal | Site: Knee | Laterality: Right

## 2019-05-07 MED ORDER — EPHEDRINE SULFATE 50 MG/ML IJ SOLN
INTRAMUSCULAR | Status: AC
  Filled 2019-05-07: qty 1

## 2019-05-07 MED ORDER — PHENOL 1.4 % MT LIQD
1.0000 | OROMUCOSAL | Status: DC | PRN
  Filled 2019-05-07: qty 177

## 2019-05-07 MED ORDER — BUPIVACAINE HCL (PF) 0.25 % IJ SOLN
INTRAMUSCULAR | Status: AC
  Filled 2019-05-07: qty 60

## 2019-05-07 MED ORDER — FAMOTIDINE 20 MG PO TABS
20.0000 mg | ORAL_TABLET | Freq: Once | ORAL | Status: AC
  Administered 2019-05-07: 07:00:00 20 mg via ORAL

## 2019-05-07 MED ORDER — PROPOFOL 500 MG/50ML IV EMUL
INTRAVENOUS | Status: AC
  Filled 2019-05-07: qty 50

## 2019-05-07 MED ORDER — SODIUM CHLORIDE 0.9 % IV SOLN
INTRAVENOUS | Status: DC | PRN
  Administered 2019-05-07: 10:00:00 60 mL

## 2019-05-07 MED ORDER — BISACODYL 10 MG RE SUPP: 10.0000 mg | Freq: Every day | RECTAL | Status: DC | PRN

## 2019-05-07 MED ORDER — ACETAMINOPHEN 325 MG PO TABS: 325.0000 mg | ORAL_TABLET | Freq: Four times a day (QID) | ORAL | Status: DC | PRN

## 2019-05-07 MED ORDER — FENTANYL CITRATE (PF) 100 MCG/2ML IJ SOLN
INTRAMUSCULAR | Status: DC | PRN
  Administered 2019-05-07 (×2): 25 ug via INTRAVENOUS

## 2019-05-07 MED ORDER — PROPOFOL 10 MG/ML IV BOLUS
INTRAVENOUS | Status: DC | PRN
  Administered 2019-05-07: 40 mg via INTRAVENOUS

## 2019-05-07 MED ORDER — ALUM & MAG HYDROXIDE-SIMETH 200-200-20 MG/5ML PO SUSP: 30.0000 mL | ORAL | Status: DC | PRN

## 2019-05-07 MED ORDER — MIDAZOLAM HCL 5 MG/5ML IJ SOLN
INTRAMUSCULAR | Status: DC | PRN
  Administered 2019-05-07 (×2): 1 mg via INTRAVENOUS

## 2019-05-07 MED ORDER — SODIUM CHLORIDE 0.9 % IV SOLN
INTRAVENOUS | Status: DC | PRN
  Administered 2019-05-07: 25 ug/min via INTRAVENOUS

## 2019-05-07 MED ORDER — SODIUM CHLORIDE FLUSH 0.9 % IV SOLN
INTRAVENOUS | Status: AC
  Filled 2019-05-07: qty 40

## 2019-05-07 MED ORDER — CELECOXIB 200 MG PO CAPS
ORAL_CAPSULE | ORAL | Status: AC
  Administered 2019-05-07: 07:00:00 400 mg via ORAL
  Filled 2019-05-07: qty 2

## 2019-05-07 MED ORDER — MAGNESIUM HYDROXIDE 400 MG/5ML PO SUSP
30.0000 mL | Freq: Every day | ORAL | Status: DC
  Administered 2019-05-08: 30 mL via ORAL
  Filled 2019-05-07 (×3): qty 30

## 2019-05-07 MED ORDER — DEXAMETHASONE SODIUM PHOSPHATE 10 MG/ML IJ SOLN
8.0000 mg | Freq: Once | INTRAMUSCULAR | Status: AC
  Administered 2019-05-07: 07:00:00 8 mg via INTRAVENOUS

## 2019-05-07 MED ORDER — MENTHOL 3 MG MT LOZG
1.0000 | LOZENGE | OROMUCOSAL | Status: DC | PRN
  Filled 2019-05-07: qty 9

## 2019-05-07 MED ORDER — PROPOFOL 500 MG/50ML IV EMUL
INTRAVENOUS | Status: DC | PRN
  Administered 2019-05-07: 75 ug/kg/min via INTRAVENOUS

## 2019-05-07 MED ORDER — PROMETHAZINE HCL 25 MG/ML IJ SOLN: 6.2500 mg | INTRAMUSCULAR | Status: DC | PRN

## 2019-05-07 MED ORDER — FENTANYL CITRATE (PF) 100 MCG/2ML IJ SOLN: 25.0000 ug | INTRAMUSCULAR | Status: DC | PRN

## 2019-05-07 MED ORDER — ACETAMINOPHEN 10 MG/ML IV SOLN
INTRAVENOUS | Status: DC | PRN
  Administered 2019-05-07: 1000 mg via INTRAVENOUS

## 2019-05-07 MED ORDER — TRANEXAMIC ACID-NACL 1000-0.7 MG/100ML-% IV SOLN
1000.0000 mg | Freq: Once | INTRAVENOUS | Status: AC
  Administered 2019-05-07: 1000 mg via INTRAVENOUS
  Filled 2019-05-07: qty 100

## 2019-05-07 MED ORDER — GENTAMICIN SULFATE 40 MG/ML IJ SOLN
INTRAMUSCULAR | Status: AC
  Filled 2019-05-07: qty 8

## 2019-05-07 MED ORDER — GLYCOPYRROLATE 0.2 MG/ML IJ SOLN
INTRAMUSCULAR | Status: AC
  Filled 2019-05-07: qty 1

## 2019-05-07 MED ORDER — ACETAMINOPHEN 10 MG/ML IV SOLN
1000.0000 mg | Freq: Four times a day (QID) | INTRAVENOUS | Status: AC
  Administered 2019-05-07 – 2019-05-08 (×4): 1000 mg via INTRAVENOUS
  Filled 2019-05-07 (×4): qty 100

## 2019-05-07 MED ORDER — ONDANSETRON HCL 4 MG PO TABS: 4.0000 mg | ORAL_TABLET | Freq: Four times a day (QID) | ORAL | Status: DC | PRN

## 2019-05-07 MED ORDER — ONDANSETRON HCL 4 MG/2ML IJ SOLN: 4.0000 mg | Freq: Four times a day (QID) | INTRAMUSCULAR | Status: DC | PRN

## 2019-05-07 MED ORDER — SODIUM CHLORIDE 0.9 % IV SOLN
INTRAVENOUS | Status: DC
  Administered 2019-05-07 (×2): via INTRAVENOUS

## 2019-05-07 MED ORDER — TRANEXAMIC ACID 1000 MG/10ML IV SOLN
INTRAVENOUS | Status: AC
  Filled 2019-05-07: qty 10

## 2019-05-07 MED ORDER — ACETAMINOPHEN 10 MG/ML IV SOLN
INTRAVENOUS | Status: AC
  Filled 2019-05-07: qty 100

## 2019-05-07 MED ORDER — CELECOXIB 200 MG PO CAPS
200.0000 mg | ORAL_CAPSULE | Freq: Two times a day (BID) | ORAL | Status: DC
  Administered 2019-05-07 – 2019-05-09 (×4): 200 mg via ORAL
  Filled 2019-05-07 (×6): qty 1

## 2019-05-07 MED ORDER — BUPIVACAINE LIPOSOME 1.3 % IJ SUSP
INTRAMUSCULAR | Status: AC
  Filled 2019-05-07: qty 20

## 2019-05-07 MED ORDER — CEFAZOLIN SODIUM-DEXTROSE 2-4 GM/100ML-% IV SOLN
2.0000 g | INTRAVENOUS | Status: AC
  Administered 2019-05-07: 2 g via INTRAVENOUS

## 2019-05-07 MED ORDER — CHLORHEXIDINE GLUCONATE 4 % EX LIQD: 60.0000 mL | Freq: Once | CUTANEOUS | Status: DC

## 2019-05-07 MED ORDER — PHENYLEPHRINE HCL (PRESSORS) 10 MG/ML IV SOLN
INTRAVENOUS | Status: DC | PRN
  Administered 2019-05-07 (×2): 100 ug via INTRAVENOUS

## 2019-05-07 MED ORDER — LIDOCAINE HCL (PF) 2 % IJ SOLN
INTRAMUSCULAR | Status: AC
  Filled 2019-05-07: qty 10

## 2019-05-07 MED ORDER — MIDAZOLAM HCL 2 MG/2ML IJ SOLN
INTRAMUSCULAR | Status: AC
  Filled 2019-05-07: qty 2

## 2019-05-07 MED ORDER — LIDOCAINE HCL (CARDIAC) PF 100 MG/5ML IV SOSY
PREFILLED_SYRINGE | INTRAVENOUS | Status: DC | PRN
  Administered 2019-05-07: 50 mg via INTRAVENOUS

## 2019-05-07 MED ORDER — MAGNESIUM GLUCONATE 500 MG PO TABS
500.0000 mg | ORAL_TABLET | Freq: Every day | ORAL | Status: DC
  Administered 2019-05-07 – 2019-05-08 (×2): 500 mg via ORAL
  Filled 2019-05-07 (×4): qty 1

## 2019-05-07 MED ORDER — FENTANYL CITRATE (PF) 100 MCG/2ML IJ SOLN
INTRAMUSCULAR | Status: AC
  Filled 2019-05-07: qty 2

## 2019-05-07 MED ORDER — FERROUS SULFATE 325 (65 FE) MG PO TABS
325.0000 mg | ORAL_TABLET | Freq: Two times a day (BID) | ORAL | Status: DC
  Administered 2019-05-08 – 2019-05-09 (×3): 325 mg via ORAL
  Filled 2019-05-07 (×3): qty 1

## 2019-05-07 MED ORDER — OXYCODONE HCL 5 MG PO TABS
10.0000 mg | ORAL_TABLET | ORAL | Status: DC | PRN
  Administered 2019-05-07 – 2019-05-09 (×4): 10 mg via ORAL
  Filled 2019-05-07 (×4): qty 2

## 2019-05-07 MED ORDER — AMLODIPINE BESYLATE 10 MG PO TABS
10.0000 mg | ORAL_TABLET | Freq: Every day | ORAL | Status: DC
  Administered 2019-05-09: 08:00:00 10 mg via ORAL
  Filled 2019-05-07: qty 1

## 2019-05-07 MED ORDER — TRAMADOL HCL 50 MG PO TABS
50.0000 mg | ORAL_TABLET | ORAL | Status: DC | PRN
  Administered 2019-05-07: 20:00:00 50 mg via ORAL
  Administered 2019-05-09: 100 mg via ORAL
  Filled 2019-05-07: qty 1
  Filled 2019-05-07: qty 2

## 2019-05-07 MED ORDER — BUPIVACAINE HCL (PF) 0.5 % IJ SOLN
INTRAMUSCULAR | Status: DC | PRN
  Administered 2019-05-07: 3 mL

## 2019-05-07 MED ORDER — ADULT MULTIVITAMIN W/MINERALS CH
1.0000 | ORAL_TABLET | Freq: Every day | ORAL | Status: DC
  Administered 2019-05-08 – 2019-05-09 (×2): 1 via ORAL
  Filled 2019-05-07 (×2): qty 1

## 2019-05-07 MED ORDER — CARVEDILOL 25 MG PO TABS
25.0000 mg | ORAL_TABLET | Freq: Two times a day (BID) | ORAL | Status: DC
  Administered 2019-05-08 – 2019-05-09 (×2): 25 mg via ORAL
  Filled 2019-05-07 (×2): qty 1

## 2019-05-07 MED ORDER — DIPHENHYDRAMINE HCL 12.5 MG/5ML PO ELIX
12.5000 mg | ORAL_SOLUTION | ORAL | Status: DC | PRN
  Filled 2019-05-07: qty 10

## 2019-05-07 MED ORDER — HYDROMORPHONE HCL 1 MG/ML IJ SOLN: 0.5000 mg | INTRAMUSCULAR | Status: DC | PRN

## 2019-05-07 MED ORDER — FAMOTIDINE 20 MG PO TABS
ORAL_TABLET | ORAL | Status: AC
  Administered 2019-05-07: 07:00:00 20 mg via ORAL
  Filled 2019-05-07: qty 1

## 2019-05-07 MED ORDER — FLEET ENEMA 7-19 GM/118ML RE ENEM: 1.0000 | ENEMA | Freq: Once | RECTAL | Status: DC | PRN

## 2019-05-07 MED ORDER — CELECOXIB 200 MG PO CAPS
400.0000 mg | ORAL_CAPSULE | Freq: Once | ORAL | Status: AC
  Administered 2019-05-07: 07:00:00 400 mg via ORAL

## 2019-05-07 MED ORDER — CEFAZOLIN SODIUM-DEXTROSE 2-4 GM/100ML-% IV SOLN
INTRAVENOUS | Status: AC
  Filled 2019-05-07: qty 100

## 2019-05-07 MED ORDER — GABAPENTIN 300 MG PO CAPS
300.0000 mg | ORAL_CAPSULE | Freq: Once | ORAL | Status: AC
  Administered 2019-05-07: 07:00:00 300 mg via ORAL

## 2019-05-07 MED ORDER — ENOXAPARIN SODIUM 30 MG/0.3ML ~~LOC~~ SOLN
30.0000 mg | Freq: Two times a day (BID) | SUBCUTANEOUS | Status: DC
  Administered 2019-05-08 – 2019-05-09 (×3): 30 mg via SUBCUTANEOUS
  Filled 2019-05-07 (×3): qty 0.3

## 2019-05-07 MED ORDER — METOCLOPRAMIDE HCL 5 MG/ML IJ SOLN: 5.0000 mg | Freq: Three times a day (TID) | INTRAMUSCULAR | Status: DC | PRN

## 2019-05-07 MED ORDER — CEFAZOLIN SODIUM-DEXTROSE 2-4 GM/100ML-% IV SOLN
2.0000 g | Freq: Four times a day (QID) | INTRAVENOUS | Status: AC
  Administered 2019-05-07 – 2019-05-08 (×4): 2 g via INTRAVENOUS
  Filled 2019-05-07 (×4): qty 100

## 2019-05-07 MED ORDER — GABAPENTIN 300 MG PO CAPS
ORAL_CAPSULE | ORAL | Status: AC
  Administered 2019-05-07: 300 mg via ORAL
  Filled 2019-05-07: qty 1

## 2019-05-07 MED ORDER — ALLOPURINOL 100 MG PO TABS
300.0000 mg | ORAL_TABLET | Freq: Every day | ORAL | Status: DC
  Administered 2019-05-08 – 2019-05-09 (×2): 300 mg via ORAL
  Filled 2019-05-07 (×2): qty 3

## 2019-05-07 MED ORDER — GABAPENTIN 300 MG PO CAPS
300.0000 mg | ORAL_CAPSULE | Freq: Every day | ORAL | Status: DC
  Administered 2019-05-07 – 2019-05-08 (×2): 300 mg via ORAL
  Filled 2019-05-07 (×2): qty 1

## 2019-05-07 MED ORDER — PANTOPRAZOLE SODIUM 40 MG PO TBEC
40.0000 mg | DELAYED_RELEASE_TABLET | Freq: Two times a day (BID) | ORAL | Status: DC
  Administered 2019-05-07 – 2019-05-09 (×4): 40 mg via ORAL
  Filled 2019-05-07 (×4): qty 1

## 2019-05-07 MED ORDER — LATANOPROST 0.005 % OP SOLN
1.0000 [drp] | Freq: Every day | OPHTHALMIC | Status: DC
  Administered 2019-05-08 – 2019-05-09 (×2): 1 [drp] via OPHTHALMIC
  Filled 2019-05-07: qty 2.5

## 2019-05-07 MED ORDER — SODIUM CHLORIDE 0.9 % IV SOLN
INTRAVENOUS | Status: DC
  Administered 2019-05-07 – 2019-05-08 (×3): via INTRAVENOUS

## 2019-05-07 MED ORDER — INSULIN ASPART 100 UNIT/ML ~~LOC~~ SOLN
0.0000 [IU] | Freq: Three times a day (TID) | SUBCUTANEOUS | Status: DC
  Administered 2019-05-07 – 2019-05-08 (×3): 3 [IU] via SUBCUTANEOUS
  Administered 2019-05-08 – 2019-05-09 (×2): 2 [IU] via SUBCUTANEOUS
  Filled 2019-05-07 (×5): qty 1

## 2019-05-07 MED ORDER — SUCCINYLCHOLINE CHLORIDE 20 MG/ML IJ SOLN
INTRAMUSCULAR | Status: AC
  Filled 2019-05-07: qty 1

## 2019-05-07 MED ORDER — PHENYLEPHRINE HCL (PRESSORS) 10 MG/ML IV SOLN
INTRAVENOUS | Status: AC
  Filled 2019-05-07: qty 1

## 2019-05-07 MED ORDER — METOCLOPRAMIDE HCL 5 MG PO TABS: 5.0000 mg | ORAL_TABLET | Freq: Three times a day (TID) | ORAL | Status: DC | PRN

## 2019-05-07 MED ORDER — DULAGLUTIDE 0.75 MG/0.5ML ~~LOC~~ SOAJ: 0.7500 mg | SUBCUTANEOUS | Status: DC

## 2019-05-07 MED ORDER — BUPIVACAINE HCL (PF) 0.25 % IJ SOLN
INTRAMUSCULAR | Status: DC | PRN
  Administered 2019-05-07: 60 mL

## 2019-05-07 MED ORDER — BUPIVACAINE HCL (PF) 0.5 % IJ SOLN
INTRAMUSCULAR | Status: AC
  Filled 2019-05-07: qty 10

## 2019-05-07 MED ORDER — IRBESARTAN 150 MG PO TABS
150.0000 mg | ORAL_TABLET | Freq: Every day | ORAL | Status: DC
  Administered 2019-05-09: 150 mg via ORAL
  Filled 2019-05-07: qty 1

## 2019-05-07 MED ORDER — CHOLECALCIFEROL 10 MCG (400 UNIT) PO TABS
2000.0000 [IU] | ORAL_TABLET | Freq: Every day | ORAL | Status: DC
  Administered 2019-05-08 – 2019-05-09 (×2): 2000 [IU] via ORAL
  Filled 2019-05-07 (×3): qty 5

## 2019-05-07 MED ORDER — TRANEXAMIC ACID 1000 MG/10ML IV SOLN
1000.0000 mg | Freq: Once | INTRAVENOUS | Status: AC
  Administered 2019-05-07: 1000 mg via INTRAVENOUS
  Filled 2019-05-07 (×2): qty 10

## 2019-05-07 MED ORDER — SENNOSIDES-DOCUSATE SODIUM 8.6-50 MG PO TABS
1.0000 | ORAL_TABLET | Freq: Two times a day (BID) | ORAL | Status: DC
  Administered 2019-05-07 – 2019-05-09 (×4): 1 via ORAL
  Filled 2019-05-07 (×4): qty 1

## 2019-05-07 MED ORDER — DOXAZOSIN MESYLATE 4 MG PO TABS
4.0000 mg | ORAL_TABLET | Freq: Two times a day (BID) | ORAL | Status: DC
  Administered 2019-05-07 – 2019-05-09 (×4): 4 mg via ORAL
  Filled 2019-05-07 (×6): qty 1

## 2019-05-07 MED ORDER — OXYCODONE HCL 5 MG PO TABS
5.0000 mg | ORAL_TABLET | ORAL | Status: DC | PRN
  Administered 2019-05-07: 17:00:00 5 mg via ORAL
  Filled 2019-05-07: qty 1

## 2019-05-07 MED ORDER — NEOMYCIN-POLYMYXIN B GU 40-200000 IR SOLN
Status: DC | PRN
  Administered 2019-05-07: 14 mL

## 2019-05-07 MED ORDER — PRAVASTATIN SODIUM 20 MG PO TABS
40.0000 mg | ORAL_TABLET | Freq: Every day | ORAL | Status: DC
  Administered 2019-05-07 – 2019-05-08 (×2): 40 mg via ORAL
  Filled 2019-05-07 (×2): qty 2

## 2019-05-07 MED ORDER — DEXAMETHASONE SODIUM PHOSPHATE 10 MG/ML IJ SOLN
INTRAMUSCULAR | Status: AC
  Administered 2019-05-07: 07:00:00 8 mg via INTRAVENOUS
  Filled 2019-05-07: qty 1

## 2019-05-07 MED ORDER — METOCLOPRAMIDE HCL 5 MG PO TABS
10.0000 mg | ORAL_TABLET | Freq: Three times a day (TID) | ORAL | Status: DC
  Administered 2019-05-07 – 2019-05-09 (×6): 10 mg via ORAL
  Filled 2019-05-07 (×6): qty 2

## 2019-05-07 SURGICAL SUPPLY — 69 items
ATTUNE MED DOME PAT 41 KNEE (Knees) ×2 IMPLANT
ATTUNE MED DOME PAT 41MM KNEE (Knees) ×1 IMPLANT
ATTUNE PS FEM RT SZ 7 CEM KNEE (Femur) ×3 IMPLANT
ATTUNE PSRP INSR SZ7 5 KNEE (Insert) ×2 IMPLANT
ATTUNE PSRP INSR SZ7 5MM KNEE (Insert) ×1 IMPLANT
BASE TIBIAL ROT PLAT SZ 8 KNEE (Knees) ×1 IMPLANT
BATTERY INSTRU NAVIGATION (MISCELLANEOUS) ×12 IMPLANT
BLADE SAW 70X12.5 (BLADE) ×3 IMPLANT
BLADE SAW 90X13X1.19 OSCILLAT (BLADE) ×3 IMPLANT
BLADE SAW 90X25X1.19 OSCILLAT (BLADE) ×3 IMPLANT
BONE CEMENT GENTAMICIN (Cement) ×6 IMPLANT
CANISTER SUCT 3000ML PPV (MISCELLANEOUS) ×3 IMPLANT
CEMENT BONE GENTAMICIN 40 (Cement) ×2 IMPLANT
COOLER POLAR GLACIER W/PUMP (MISCELLANEOUS) ×3 IMPLANT
COVER WAND RF STERILE (DRAPES) ×3 IMPLANT
CUFF TOURN SGL QUICK 24 (TOURNIQUET CUFF)
CUFF TOURN SGL QUICK 30 (TOURNIQUET CUFF)
CUFF TRNQT CYL 24X4X16.5-23 (TOURNIQUET CUFF) IMPLANT
CUFF TRNQT CYL 30X4X21-28X (TOURNIQUET CUFF) IMPLANT
DRAPE SHEET LG 3/4 BI-LAMINATE (DRAPES) ×3 IMPLANT
DRSG DERMACEA 8X12 NADH (GAUZE/BANDAGES/DRESSINGS) ×3 IMPLANT
DRSG OPSITE POSTOP 4X14 (GAUZE/BANDAGES/DRESSINGS) ×3 IMPLANT
DRSG TEGADERM 4X4.75 (GAUZE/BANDAGES/DRESSINGS) ×3 IMPLANT
DURAPREP 26ML APPLICATOR (WOUND CARE) ×6 IMPLANT
ELECT REM PT RETURN 9FT ADLT (ELECTROSURGICAL) ×3
ELECTRODE REM PT RTRN 9FT ADLT (ELECTROSURGICAL) ×1 IMPLANT
EX-PIN ORTHOLOCK NAV 4X150 (PIN) ×6 IMPLANT
GLOVE BIOGEL M STRL SZ7.5 (GLOVE) ×6 IMPLANT
GLOVE INDICATOR 8.0 STRL GRN (GLOVE) ×3 IMPLANT
GOWN STRL REUS W/ TWL LRG LVL3 (GOWN DISPOSABLE) ×2 IMPLANT
GOWN STRL REUS W/TWL LRG LVL3 (GOWN DISPOSABLE) ×4
HEMOVAC 400CC 10FR (MISCELLANEOUS) ×3 IMPLANT
HOLDER FOLEY CATH W/STRAP (MISCELLANEOUS) ×3 IMPLANT
HOOD PEEL AWAY FLYTE STAYCOOL (MISCELLANEOUS) ×6 IMPLANT
KIT TURNOVER KIT A (KITS) ×3 IMPLANT
KNIFE SCULPS 14X20 (INSTRUMENTS) ×3 IMPLANT
LABEL OR SOLS (LABEL) ×3 IMPLANT
MANIFOLD NEPTUNE WASTE (CANNULA) ×3 IMPLANT
NDL SAFETY ECLIPSE 18X1.5 (NEEDLE) ×1 IMPLANT
NEEDLE HYPO 18GX1.5 SHARP (NEEDLE) ×2
NEEDLE SPNL 20GX3.5 QUINCKE YW (NEEDLE) ×6 IMPLANT
NS IRRIG 500ML POUR BTL (IV SOLUTION) ×3 IMPLANT
PACK TOTAL KNEE (MISCELLANEOUS) ×3 IMPLANT
PAD WRAPON POLAR KNEE (MISCELLANEOUS) ×1 IMPLANT
PENCIL SMOKE ULTRAEVAC 22 CON (MISCELLANEOUS) ×3 IMPLANT
PIN DRILL QUICK PACK ×3 IMPLANT
PIN FIXATION 1/8DIA X 3INL (PIN) ×9 IMPLANT
PULSAVAC PLUS IRRIG FAN TIP (DISPOSABLE) ×3
SOL .9 NS 3000ML IRR  AL (IV SOLUTION) ×2
SOL .9 NS 3000ML IRR UROMATIC (IV SOLUTION) ×1 IMPLANT
SOL PREP PVP 2OZ (MISCELLANEOUS) ×3
SOLUTION PREP PVP 2OZ (MISCELLANEOUS) ×1 IMPLANT
SPONGE DRAIN TRACH 4X4 STRL 2S (GAUZE/BANDAGES/DRESSINGS) ×3 IMPLANT
STAPLER SKIN PROX 35W (STAPLE) ×3 IMPLANT
STOCKINETTE IMPERV 14X48 (MISCELLANEOUS) IMPLANT
STRAP TIBIA SHORT (MISCELLANEOUS) ×3 IMPLANT
SUCTION FRAZIER HANDLE 10FR (MISCELLANEOUS) ×2
SUCTION TUBE FRAZIER 10FR DISP (MISCELLANEOUS) ×1 IMPLANT
SUT VIC AB 0 CT1 36 (SUTURE) ×6 IMPLANT
SUT VIC AB 1 CT1 36 (SUTURE) ×6 IMPLANT
SUT VIC AB 2-0 CT2 27 (SUTURE) ×3 IMPLANT
SYR 20CC LL (SYRINGE) ×3 IMPLANT
SYR 30ML LL (SYRINGE) ×6 IMPLANT
TIBIAL BASE ROT PLAT SZ 8 KNEE (Knees) ×3 IMPLANT
TIP FAN IRRIG PULSAVAC PLUS (DISPOSABLE) ×1 IMPLANT
TOWEL OR 17X26 4PK STRL BLUE (TOWEL DISPOSABLE) ×3 IMPLANT
TOWER CARTRIDGE SMART MIX (DISPOSABLE) ×3 IMPLANT
TRAY FOLEY MTR SLVR 16FR STAT (SET/KITS/TRAYS/PACK) ×3 IMPLANT
WRAPON POLAR PAD KNEE (MISCELLANEOUS) ×3

## 2019-05-07 NOTE — Transfer of Care (Signed)
Immediate Anesthesia Transfer of Care Note  Patient: Lawrence Patterson  Procedure(s) Performed: COMPUTER ASSISTED TOTAL KNEE ARTHROPLASTY RIGHT (Right Knee)  Patient Location: PACU  Anesthesia Type:Spinal  Level of Consciousness: awake, alert  and oriented  Airway & Oxygen Therapy: Patient Spontanous Breathing and Patient connected to face mask oxygen  Post-op Assessment: Report given to RN and Post -op Vital signs reviewed and stable  Post vital signs: Reviewed and stable  Last Vitals:  Vitals Value Taken Time  BP 103/69 05/07/2019 11:33 AM  Temp 36.6 C 05/07/2019 11:33 AM  Pulse 72 05/07/2019 11:33 AM  Resp 24 05/07/2019 11:33 AM  SpO2 97 % 05/07/2019 11:33 AM  Vitals shown include unvalidated device data.  Last Pain:  Vitals:   05/07/19 1133  TempSrc: Temporal  PainSc:          Complications: No apparent anesthesia complications

## 2019-05-07 NOTE — Anesthesia Procedure Notes (Signed)
Date/Time: 05/07/2019 8:00 AM Performed by: [name], CRNA Pre-anesthesia Checklist: Patient identified, Emergency Drugs available, Suction available, Timeout performed and Patient being monitored Patient Re-evaluated:Patient Re-evaluated prior to induction Oxygen Delivery Method: Simple face mask Preoxygenation: Pre-oxygenation with 100% oxygen Induction Type: IV induction

## 2019-05-07 NOTE — Anesthesia Post-op Follow-up Note (Signed)
Anesthesia QCDR form completed.        

## 2019-05-07 NOTE — Anesthesia Procedure Notes (Signed)
Spinal  Patient location during procedure: OR Start time: 05/07/2019 7:50 AM End time: 05/07/2019 8:01 AM Staffing Anesthesiologist: Martha Clan, MD Resident/CRNA: [name], CRNA Performed: resident/CRNA and anesthesiologist  Preanesthetic Checklist Completed: patient identified, site marked, surgical consent, pre-op evaluation, timeout performed, IV checked, risks and benefits discussed and monitors and equipment checked Spinal Block Patient position: sitting Prep: ChloraPrep Patient monitoring: heart rate, continuous pulse ox, blood pressure and cardiac monitor Approach: midline Location: L3-4 Injection technique: single-shot Needle Needle type: Whitacre and Introducer  Needle gauge: 24 G Needle length: 9 cm Assessment Sensory level: T10 Additional Notes Negative paresthesia. Negative blood return. Positive free-flowing CSF. Expiration date of kit checked and confirmed. Patient tolerated procedure well, without complications.

## 2019-05-07 NOTE — TOC Progression Note (Signed)
Transition of Care [facility]) - Progression Note    Patient Details  Name: Lawrence Patterson MRN: [id number] Date of Birth: 1951/02/29  Transition of Care [facility]) CM/SW [place], RN Phone Number: 05/07/2019, 9:35 AM  Clinical Narrative:        Sent request to CM bucket via email to check the price of Lovenox    Expected Discharge Plan and Services                                                 Social Determinants of Health (SDOH) Interventions    Readmission Risk Interventions No flowsheet data found.

## 2019-05-07 NOTE — H&P (Signed)
The patient has been re-examined, and the chart reviewed, and there have been no interval changes to the documented history and physical.    The risks, benefits, and alternatives have been discussed at length. The patient expressed understanding of the risks benefits and agreed with plans for surgical intervention.  Cornelius Marullo P. Ulises Wolfinger, Jr. M.D.    

## 2019-05-07 NOTE — TOC Benefit Eligibility Note (Signed)
Transition of Care [facility]) Benefit Eligibility Note    Patient Details  Name: Lawrence Patterson MRN: [id number] Date of Birth: 30-Oct-1951   Medication/Dose: Lovenox 92m once daily for 14 days  Covered?: No  Prescription Coverage Preferred Pharmacy: Any retail pharmacy  Spoke with Person/Company/Phone Number:: TFestus Holts  Prior Approval: Yes(PA required for name brand: [phone])  Deductible: (Rep unable to disclose deductible amount and if met. )  Additional Notes: Generic Enoxaparin covered with no PA required.  Estimated copay: $8.28.    HDannette BarbaraPhone Number: [phone] [phone]/25/2020, 3:30 PM

## 2019-05-07 NOTE — Op Note (Signed)
OPERATIVE NOTE  DATE OF SURGERY:  05/07/2019  PATIENT NAME:  Lawrence Patterson   DOB: 1951/06/18  MRN: [id number]  PRE-OPERATIVE DIAGNOSIS: Degenerative arthrosis of the right knee, primary  POST-OPERATIVE DIAGNOSIS:  Same  PROCEDURE:  Right total knee arthroplasty using computer-assisted navigation  SURGEON:  Marciano Sequin. M.D.  ASSISTANT:  Maximino Greenland, RN (present and scrubbed throughout the case, critical for assistance with exposure, retraction, instrumentation, and closure)  ANESTHESIA: spinal  ESTIMATED BLOOD LOSS: 50 mL  FLUIDS REPLACED: 800 mL of crystalloid  TOURNIQUET TIME: 98 minutes  DRAINS: 2 medium Hemovac drains  SOFT TISSUE RELEASES: Anterior cruciate ligament, posterior cruciate ligament, deep medial collateral ligament, patellofemoral ligament  IMPLANTS UTILIZED: DePuy Attune size 7 posterior stabilized femoral component (cemented), size 8 rotating platform tibial component (cemented), 41 mm medialized dome patella (cemented), and a 5 mm stabilized rotating platform polyethylene insert.  INDICATIONS FOR SURGERY: Lawrence Patterson is a 68 y.o. year old male with a long history of progressive knee pain. X-rays demonstrated severe degenerative changes in tricompartmental fashion. The patient had not seen any significant improvement despite conservative nonsurgical intervention. After discussion of the risks and benefits of surgical intervention, the patient expressed understanding of the risks benefits and agree with plans for total knee arthroplasty.   The risks, benefits, and alternatives were discussed at length including but not limited to the risks of infection, bleeding, nerve injury, stiffness, blood clots, the need for revision surgery, cardiopulmonary complications, among others, and they were willing to proceed.  PROCEDURE IN DETAIL: The patient was brought into the operating room and, after adequate spinal anesthesia was achieved, a tourniquet was  placed on the patient's upper thigh. The patient's knee and leg were cleaned and prepped with alcohol and DuraPrep and draped in the usual sterile fashion. A "timeout" was performed as per usual protocol. The lower extremity was exsanguinated using an Esmarch, and the tourniquet was inflated to 300 mmHg. An anterior longitudinal incision was made followed by a standard mid vastus approach. The deep fibers of the medial collateral ligament were elevated in a subperiosteal fashion off of the medial flare of the tibia so as to maintain a continuous soft tissue sleeve. The patella was subluxed laterally and the patellofemoral ligament was incised. Inspection of the knee demonstrated severe degenerative changes with full-thickness loss of articular cartilage. Osteophytes were debrided using a rongeur. Anterior and posterior cruciate ligaments were excised. Two 4.0 mm Schanz pins were inserted in the femur and into the tibia for attachment of the array of trackers used for computer-assisted navigation. Hip center was identified using a circumduction technique. Distal landmarks were mapped using the computer. The distal femur and proximal tibia were mapped using the computer. The distal femoral cutting guide was positioned using computer-assisted navigation so as to achieve a 5 distal valgus cut. The femur was sized and it was felt that a size 7 femoral component was appropriate. A size 7 femoral cutting guide was positioned and the anterior cut was performed and verified using the computer. This was followed by completion of the posterior and chamfer cuts. Femoral cutting guide for the central box was then positioned in the center box cut was performed.  Attention was then directed to the proximal tibia. Medial and lateral menisci were excised. The extramedullary tibial cutting guide was positioned using computer-assisted navigation so as to achieve a 0 varus-valgus alignment and 3 posterior slope. The cut was  performed and verified using the computer. The proximal tibia  was sized and it was felt that a size 8 tibial tray was appropriate. Tibial and femoral trials were inserted followed by insertion of a 5 mm polyethylene insert. This allowed for excellent mediolateral soft tissue balancing both in flexion and in full extension. Finally, the patella was cut and prepared so as to accommodate a 41 mm medialized dome patella. A patella trial was placed and the knee was placed through a range of motion with excellent patellar tracking appreciated. The femoral trial was removed after debridement of posterior osteophytes. The central post-hole for the tibial component was reamed followed by insertion of a keel punch. Tibial trials were then removed. Cut surfaces of bone were irrigated with copious amounts of normal saline with antibiotic solution using pulsatile lavage and then suctioned dry. Polymethylmethacrylate cement with gentamicin was prepared in the usual fashion using a vacuum mixer. Cement was applied to the cut surface of the proximal tibia as well as along the undersurface of a size 8 rotating platform tibial component. Tibial component was positioned and impacted into place. Excess cement was removed using [employer] fast streamer. Cement was then applied to the cut surfaces of the femur as well as along the posterior flanges of the size 7 femoral component. The femoral component was positioned and impacted into place. Excess cement was removed using [employer] fast streamer. A 5 mm polyethylene trial was inserted and the knee was brought into full extension with steady axial compression applied. Finally, cement was applied to the backside of a 41 mm medialized dome patella and the patellar component was positioned and patellar clamp applied. Excess cement was removed using [employer] fast streamer. After adequate curing of the cement, the tourniquet was deflated after a total tourniquet time of 98 minutes. Hemostasis was achieved using  electrocautery. The knee was irrigated with copious amounts of normal saline with antibiotic solution using pulsatile lavage and then suctioned dry. 20 mL of 1.3% Exparel and 60 mL of 0.25% Marcaine in 40 mL of normal saline was injected along the posterior capsule, medial and lateral gutters, and along the arthrotomy site. A 5 mm stabilized rotating platform polyethylene insert was inserted and the knee was placed through a range of motion with excellent mediolateral soft tissue balancing appreciated and excellent patellar tracking noted. 2 medium drains were placed in the wound bed and brought out through separate stab incisions. The medial parapatellar portion of the incision was reapproximated using interrupted sutures of #1 Vicryl. Subcutaneous tissue was approximated in layers using first #0 Vicryl followed #2-0 Vicryl. The skin was approximated with skin staples. A sterile dressing was applied.  The patient tolerated the procedure well and was transported to the recovery room in stable condition.     P. Holley Bouche., M.D.

## 2019-05-07 NOTE — Anesthesia Preprocedure Evaluation (Signed)
Anesthesia Evaluation  Patient identified by MRN, date of birth, ID band Patient awake    Reviewed: Allergy & Precautions, H&P , NPO status , Patient's Chart, lab work & pertinent test results, reviewed documented beta blocker date and time   History of Anesthesia Complications Negative for: history of anesthetic complications  Airway Mallampati: II  TM Distance: >3 FB Neck ROM: full    Dental  (+) Dental Advidsory Given, Caps, Missing   Pulmonary neg pulmonary ROS,           Cardiovascular Exercise Tolerance: Good hypertension, (-) angina+ Peripheral Vascular Disease  (-) Past MI and (-) Cardiac Stents (-) dysrhythmias (-) Valvular Problems/Murmurs     Neuro/Psych negative neurological ROS  negative psych ROS   GI/Hepatic negative GI ROS, Neg liver ROS,   Endo/Other  negative endocrine ROSdiabetes  Renal/GU CRFRenal disease  negative genitourinary   Musculoskeletal   Abdominal   Peds  Hematology negative hematology ROS (+)   Anesthesia Other Findings Past Medical History: No date: Arthritis No date: Chronic kidney disease     Comment:  increase protein/ diabetes No date: Complication of anesthesia     Comment:  uvular trauma.  Required a uvulectomy No date: Diabetes mellitus ([place]) No date: Hypertension No date: Proteinuria No date: PVD (peripheral vascular disease) ([facility])     Comment:  vericosity(had surgery)   Reproductive/Obstetrics negative OB ROS                             Anesthesia Physical Anesthesia Plan  ASA: III  Anesthesia Plan: Spinal   Post-op Pain Management:    Induction:   PONV Risk Score and Plan: 1 and Propofol infusion and TIVA  Airway Management Planned: Simple Face Mask and Natural Airway  Additional Equipment:   Intra-op Plan:   Post-operative Plan:   Informed Consent: I have reviewed the patients History and Physical, chart, labs and  discussed the procedure including the risks, benefits and alternatives for the proposed anesthesia with the patient or authorized representative who has indicated his/her understanding and acceptance.     Dental Advisory Given  Plan Discussed with: Anesthesiologist, CRNA and Surgeon  Anesthesia Plan Comments:         Anesthesia Quick Evaluation

## 2019-05-07 NOTE — Evaluation (Signed)
Physical Therapy Evaluation Patient Details Name: Lawrence Patterson MRN: [id number] DOB: [birth year] Today's Date: 05/07/2019   History of Present Illness  Patient is a pleasant 68 year old male s/p R TKA 05/07/19. Patient PMH includes DM, HTN, and PVD. Patient very active prior to surgery working out 4-5x/week.   Clinical Impression  Patient is a pleasant motivated 68 year old male s/p R TKA. Patient demonstrates good safety awareness and motivation for progression to return to full function. Patient educated and given HEP/education packet with interventions. Educated on [place], proper use of RW, and ambulatory technique with a focus on proper hand placement. Transfers are challenging to patient requiring cueing for correct hand placement as well as increased effort at this time. Patient's ROM is as follows: seated 90 degrees, supine 72 degrees with towel AAROM, extension -3 degrees. Patient able to perform SLR effectively however it does fatigue after a few sets. Patient will benefit from skilled physical therapy while in hospital to increase functional knee ROM, strength, and mobility. Patient will benefit from HHPT upon d/c to return to PLOF.     Follow Up Recommendations Home health PT    Equipment Recommendations  Standard walker;3in1 (PT)(3 in 1 or shower chair)    Recommendations for Other Services       Precautions / Restrictions Precautions Precautions: Fall Restrictions Weight Bearing Restrictions: Yes RLE Weight Bearing: Weight bearing as tolerated      Mobility  Bed Mobility Overal bed mobility: Modified Independent             General bed mobility comments: Patient requires extra time and use of handrails. Verbal cueing for hooking LLE under RLE to bring to EOB, same to return back to bed  Transfers Overall transfer level: Needs assistance Equipment used: Rolling walker (2 wheeled) Transfers: Sit to/from Stand Sit to Stand: Min guard;Min assist         General  transfer comment: Min A from standard height surface with cueing/direction for correct hand placement and sequencing. Upon standing cueing for breathing to reduce dizziness.   Ambulation/Gait Ambulation/Gait assistance: Min guard Gait Distance (Feet): 160 Feet Assistive device: Rolling walker (2 wheeled)   Gait velocity: decreased   General Gait Details: Decreased stance phase on RLE with noted trunk flexion, improving step length with prolonged ambulation. Narrow BOS , cueing for hand placement  Stairs            Wheelchair Mobility    Modified Rankin (Stroke Patients Only)       Balance Overall balance assessment: Needs assistance Sitting-balance support: Feet supported Sitting balance-Leahy Scale: Good Sitting balance - Comments: reach inside and outside BOS without LOB   Standing balance support: No upper extremity supported;Bilateral upper extremity supported;During functional activity Standing balance-Leahy Scale: Fair Standing balance comment: Able to static stand without UE support, requires use of RW for ambulation due to fatigue of RLE               High Level Balance Comments: static stand without UE support             Pertinent Vitals/Pain Pain Assessment: No/denies pain    Home Living Family/patient expects to be discharged to:: Private residence Living Arrangements: Spouse/significant other Available Help at Discharge: Family Type of Home: House Home Access: Stairs to enter   Technical brewer of Steps: 1 Home Layout: One level(1 step down into den) Home Equipment: Gilford Rile - 2 wheels(wife's walker) Additional Comments: Patient lives at home with his wife. 1  step to enter house and one step down/up into den within home.     Prior Function Level of Independence: Independent         Comments: Patient is very active, working out/lifting weights 4-5 days/week. Enjoys gardening and mows lawns.      Hand Dominance         Extremity/Trunk Assessment   Upper Extremity Assessment Upper Extremity Assessment: Overall WFL for tasks assessed    Lower Extremity Assessment Lower Extremity Assessment: RLE deficits/detail;LLE deficits/detail RLE Deficits / Details: not assessed due to post op day 0  RLE Sensation: (able to feel feet and thigh touch; wrapping on knee limits assessment) RLE Coordination: decreased gross motor LLE Deficits / Details: 5/5 LLE Sensation: WNL LLE Coordination: WNL       Communication   Communication: No difficulties  Cognition Arousal/Alertness: Awake/alert Behavior During Therapy: WFL for tasks assessed/performed Overall Cognitive Status: Within Functional Limits for tasks assessed                                 General Comments: Patient A and O x 4, eager to participate in physical thearpy, has a brother who had knee replacements and wants to do better than him.       General Comments General comments (skin integrity, edema, etc.): patient has high fitness level and eagerness to participate in exercises    Exercises Total Joint Exercises Ankle Circles/Pumps: Strengthening;AROM;Both;10 reps;Supine Quad Sets: Strengthening;Both;10 reps;Supine Heel Slides: AAROM;Right;10 reps;Supine Straight Leg Raises: AROM;Strengthening;Right;Supine;5 reps Long Arc Quad: AAROM;Strengthening;Right;Seated;15 reps Knee Flexion: AAROM;Strengthening;Right;15 reps;Seated Goniometric ROM: supine aarom with towel: 72 degrees with -3 degrees extension, seated arom: knee flexion: 90 degrees.  Other Exercises Other Exercises: patient given and educated on TKA exercise packet.  Other Exercises: standing weight shift with and withotu UE support to promote stability and COM.  Other Exercises: seated EOB stability with focus on knee flexion and extension with slow controlled muscle recruitment   Assessment/Plan    PT Assessment Patient needs continued PT services  PT Problem List  Decreased strength;Decreased range of motion;Decreased activity tolerance;Decreased coordination;Decreased mobility;Decreased balance;Decreased knowledge of use of DME;Pain       PT Treatment Interventions DME instruction;Gait training;Stair training;Functional mobility training;Therapeutic activities;Therapeutic exercise;Neuromuscular re-education;Balance training;Patient/family education;Manual techniques    PT Goals (Current goals can be found in the Care Plan section)  Acute Rehab PT Goals Patient Stated Goal: to return to working out and go home PT Goal Formulation: With patient Time For Goal Achievement: 05/21/19 Potential to Achieve Goals: Good    Frequency BID   Barriers to discharge   would benefit from HHPT     Co-evaluation               AM-PAC PT "6 Clicks" Mobility  Outcome Measure Help needed turning from your back to your side while in a flat bed without using bedrails?: A Little Help needed moving from lying on your back to sitting on the side of a flat bed without using bedrails?: A Little Help needed moving to and from a bed to a chair (including a wheelchair)?: A Little Help needed standing up from a chair using your arms (e.g., wheelchair or bedside chair)?: A Little Help needed to walk in hospital room?: A Little Help needed climbing 3-5 steps with a railing? : A Lot 6 Click Score: 17    End of Session Equipment Utilized During Treatment: Gait belt Activity Tolerance:  Patient tolerated treatment well Patient left: in bed;with call bell/phone within reach;with bed alarm set Nurse Communication: Mobility status;Weight bearing status PT Visit Diagnosis: Unsteadiness on feet (R26.81);Other abnormalities of gait and mobility (R26.89);Muscle weakness (generalized) (M62.81);Difficulty in walking, not elsewhere classified (R26.2);Pain Pain - Right/Left: Right Pain - part of body: Knee    Time: 5537-4827 PT Time Calculation (min) (ACUTE ONLY): 55  min   Charges:   PT Evaluation $PT Eval Low Complexity: 1 Low PT Treatments $Gait Training: 8-22 mins $Therapeutic Exercise: 23-37 mins        Janna Arch, PT, DPT    Janna Arch 05/07/2019, 4:29 PM

## 2019-05-08 ENCOUNTER — Encounter: Payer: Self-pay | Admitting: Orthopedic Surgery

## 2019-05-08 LAB — GLUCOSE, CAPILLARY
Glucose-Capillary: 146 mg/dL — ABNORMAL HIGH (ref 70–99)
Glucose-Capillary: 165 mg/dL — ABNORMAL HIGH (ref 70–99)
Glucose-Capillary: 133 mg/dL — ABNORMAL HIGH (ref 70–99)
Glucose-Capillary: 119 mg/dL — ABNORMAL HIGH (ref 70–99)

## 2019-05-08 NOTE — Progress Notes (Signed)
Physical Therapy Treatment Patient Details Name: Lawrence Patterson MRN: [id number] DOB: 1951-09-19 Today's Date: 05/08/2019    History of Present Illness Patient is a pleasant 68 year old male s/p R TKA 05/07/19. Patient PMH includes DM, HTN, and PVD. Patient very active prior to surgery working out 4-5x/week.     PT Comments    Pt reporting 1/10 R knee pain beginning of session and 3/10 end of session.  R knee AROM 0-105 degrees today.  Pt requiring cueing for transfer technique in order to stand with CGA.  Able to ambulate around nursing loop with RW CGA (mild decreased stance time R LE noted).  Overall pt is progressing with therapy well and demonstrated good understanding of written semi-supine LE HEP.  Will trial stairs this afternoon.   Follow Up Recommendations  Home health PT     Equipment Recommendations  Rolling walker with 5" wheels;3in1 (PT)    Recommendations for Other Services OT consult     Precautions / Restrictions Precautions Precautions: Fall Restrictions Weight Bearing Restrictions: Yes RLE Weight Bearing: Weight bearing as tolerated    Mobility  Bed Mobility Overal bed mobility: Modified Independent             General bed mobility comments: Semi-supine to sit with HOB elevated; increased effort/time to perform on own; pt hooked L LE under R LE to assist with bringing to EOB.  Transfers Overall transfer level: Needs assistance Equipment used: Rolling walker (2 wheeled) Transfers: Sit to/from Stand Sit to Stand: Min guard         General transfer comment: pt unable to stand initial attempt on own but with vc's for UE/LE placement and technique pt able to stand with CGA and increased effort  Ambulation/Gait Ambulation/Gait assistance: Min guard Gait Distance (Feet): 200 Feet Assistive device: Rolling walker (2 wheeled)   Gait velocity: decreased   General Gait Details: mild decreased stance time R LE; good R heel strike; good R knee flexion  during swing phase; steady   Chief Strategy Officer    Modified Rankin (Stroke Patients Only)       Balance Overall balance assessment: Needs assistance Sitting-balance support: No upper extremity supported;Feet supported Sitting balance-Leahy Scale: Normal Sitting balance - Comments: steady sitting reaching outside BOS   Standing balance support: No upper extremity supported Standing balance-Leahy Scale: Fair Standing balance comment: steady static standing                            Cognition Arousal/Alertness: Awake/alert Behavior During Therapy: WFL for tasks assessed/performed Overall Cognitive Status: Within Functional Limits for tasks assessed                                        Exercises Total Joint Exercises Ankle Circles/Pumps: AROM;Strengthening;Both;10 reps;Supine Quad Sets: AROM;Strengthening;Right;10 reps;Supine Short Arc Quad: AROM;Strengthening;Right;10 reps;Supine Heel Slides: AAROM;Strengthening;Right;10 reps;Supine Hip ABduction/ADduction: AROM;Strengthening;Right;10 reps;Supine Straight Leg Raises: AROM;Strengthening;Right;10 reps;Supine Goniometric ROM: R knee AROM 0-105 degrees    General Comments General comments (skin integrity, edema, etc.): R knee hemovac in place.  Nursing cleared pt for participation in physical therapy.  Pt agreeable to PT session.      Pertinent Vitals/Pain Pain Assessment: 0-10 Pain Score: 3  Pain Location: R knee Pain Descriptors / Indicators: Sore Pain Intervention(s): Limited activity  within patient's tolerance;Monitored during session;Premedicated before session;Repositioned;Other (comment)(polar care)  Vitals (HR and O2 on room air) stable and WFL throughout treatment session.    Home Living                      Prior Function            PT Goals (current goals can now be found in the care plan section) Acute Rehab PT Goals Patient Stated Goal:  to return to working out and go home PT Goal Formulation: With patient Time For Goal Achievement: 05/21/19 Potential to Achieve Goals: Good    Frequency    BID      PT Plan      Co-evaluation              AM-PAC PT "6 Clicks" Mobility   Outcome Measure  Help needed turning from your back to your side while in a flat bed without using bedrails?: A Little Help needed moving from lying on your back to sitting on the side of a flat bed without using bedrails?: A Little Help needed moving to and from a bed to a chair (including a wheelchair)?: A Little Help needed standing up from a chair using your arms (e.g., wheelchair or bedside chair)?: A Little Help needed to walk in hospital room?: A Little Help needed climbing 3-5 steps with a railing? : A Little 6 Click Score: 18    End of Session Equipment Utilized During Treatment: Gait belt Activity Tolerance: Patient tolerated treatment well Patient left: in chair;with call bell/phone within reach;with chair alarm set;with SCD's reapplied;Other (comment)(B heels elevated via towel rolls; polar care in place and activated) Nurse Communication: Mobility status;Precautions;Weight bearing status PT Visit Diagnosis: Unsteadiness on feet (R26.81);Other abnormalities of gait and mobility (R26.89);Muscle weakness (generalized) (M62.81);Difficulty in walking, not elsewhere classified (R26.2);Pain Pain - Right/Left: Right Pain - part of body: Knee     Time: 3007-6226 PT Time Calculation (min) (ACUTE ONLY): 46 min  Charges:  $Gait Training: 8-22 mins $Therapeutic Exercise: 8-22 mins $Therapeutic Activity: 8-22 mins                    Leitha Bleak, PT 05/08/19, 10:01 AM [phone]

## 2019-05-08 NOTE — Anesthesia Postprocedure Evaluation (Signed)
Anesthesia Post Note  Patient: Lawrence Patterson  Procedure(s) Performed: COMPUTER ASSISTED TOTAL KNEE ARTHROPLASTY RIGHT (Right Knee)  Patient location during evaluation: Nursing Unit Anesthesia Type: Spinal Level of consciousness: awake and awake and alert Pain management: pain level controlled Vital Signs Assessment: post-procedure vital signs reviewed and stable Respiratory status: spontaneous breathing, nonlabored ventilation and respiratory function stable Cardiovascular status: blood pressure returned to baseline and stable Postop Assessment: no headache, no backache and no apparent nausea or vomiting Anesthetic complications: no     Last Vitals:  Vitals:   05/08/19 0502 05/08/19 0728  BP: 138/78 132/76  Pulse: 73 76  Resp: 19 17  Temp: 36.7 C 36.7 C  SpO2: 96% 95%    Last Pain:  Vitals:   05/08/19 0728  TempSrc: Oral  PainSc:                  [name]

## 2019-05-08 NOTE — Progress Notes (Signed)
Physical Therapy Treatment Patient Details Name: Lawrence Patterson MRN: [id number] DOB: [birth year] Today's Date: 05/08/2019    History of Present Illness Patient is a pleasant 68 year old male s/p R TKA 05/07/19. Patient PMH includes DM, HTN, and PVD. Patient very active prior to surgery working out 4-5x/week.     PT Comments    Pt able to ambulate 200 feet with RW CGA and navigated 4 steps with UE support CGA (after initial demo/vc's).  Initial cueing for transfer technique required but improved with practice.  0/10 R knee pain at rest beginning of session; 3/10 end of session at rest.  Will continue to focus on strengthening, knee ROM, and progressive functional mobility during hospital stay.      Follow Up Recommendations  Home health PT     Equipment Recommendations  Rolling walker with 5" wheels;3in1 (PT)    Recommendations for Other Services OT consult     Precautions / Restrictions Precautions Precautions: Fall Restrictions Weight Bearing Restrictions: Yes RLE Weight Bearing: Weight bearing as tolerated    Mobility  Bed Mobility               General bed mobility comments: Deferred (pt up in chair beginning/end of session)  Transfers Overall transfer level: Needs assistance Equipment used: Rolling walker (2 wheeled) Transfers: Sit to/from Stand Sit to Stand: Min guard;Supervision         General transfer comment: initial vc's for technique; x3 trials for practice from recliner  Ambulation/Gait Ambulation/Gait assistance: Min guard Gait Distance (Feet): 200 Feet Assistive device: Rolling walker (2 wheeled)   Gait velocity: decreased   General Gait Details: mild decreased stance time R LE; good R heel strike; good R knee flexion during swing phase; steady   Stairs Stairs: Yes Stairs assistance: Min guard Stair Management: Two rails;Step to pattern;Forwards;With walker Number of Stairs: 4 General stair comments: ascended/descended 3 steps with B  railings CGA; ascended/descended 1 step with RW CGA; initial demo and vc's for technique and then pt able to perform on own without any further cueing   Wheelchair Mobility    Modified Rankin (Stroke Patients Only)       Balance Overall balance assessment: Needs assistance Sitting-balance support: No upper extremity supported;Feet supported Sitting balance-Leahy Scale: Normal Sitting balance - Comments: steady sitting reaching outside BOS   Standing balance support: No upper extremity supported Standing balance-Leahy Scale: Good Standing balance comment: steady standing reaching within BOS                            Cognition Arousal/Alertness: Awake/alert Behavior During Therapy: WFL for tasks assessed/performed Overall Cognitive Status: Within Functional Limits for tasks assessed                                      Exercises Total Joint Exercises Long Arc Quad: AROM;Strengthening;Right;10 reps;Seated Knee Flexion: AAROM;Strengthening;Right;10 reps;Seated(paper towel under pt's foot to allow for sliding)    General Comments General comments (skin integrity, edema, etc.): R knee hemovac in place.  Nursing cleared pt for participation in physical therapy.  Pt agreeable to PT session.      Pertinent Vitals/Pain Pain Assessment: 0-10 Pain Score: 3  Pain Location: R knee Pain Descriptors / Indicators: Sore Pain Intervention(s): Limited activity within patient's tolerance;Monitored during session;Premedicated before session;Repositioned;Other (comment)(polar care)  Vitals (HR and O2 on room air) stable  and WFL throughout treatment session.    Home Living    Prior Function        PT Goals (current goals can now be found in the care plan section) Acute Rehab PT Goals Patient Stated Goal: to return to working out and go home PT Goal Formulation: With patient Time For Goal Achievement: 05/21/19 Potential to Achieve Goals: Good Progress  towards PT goals: Progressing toward goals    Frequency    BID      PT Plan Current plan remains appropriate    Co-evaluation              AM-PAC PT "6 Clicks" Mobility   Outcome Measure  Help needed turning from your back to your side while in a flat bed without using bedrails?: A Little Help needed moving from lying on your back to sitting on the side of a flat bed without using bedrails?: A Little Help needed moving to and from a bed to a chair (including a wheelchair)?: A Little Help needed standing up from a chair using your arms (e.g., wheelchair or bedside chair)?: A Little Help needed to walk in hospital room?: A Little Help needed climbing 3-5 steps with a railing? : A Little 6 Click Score: 18    End of Session Equipment Utilized During Treatment: Gait belt Activity Tolerance: Patient tolerated treatment well Patient left: in chair;with call bell/phone within reach;with chair alarm set;with SCD's reapplied;Other (comment)(B heels elevated via towel rolls; polar care in place and activated) Nurse Communication: Mobility status;Precautions;Weight bearing status PT Visit Diagnosis: Unsteadiness on feet (R26.81);Other abnormalities of gait and mobility (R26.89);Muscle weakness (generalized) (M62.81);Difficulty in walking, not elsewhere classified (R26.2);Pain Pain - Right/Left: Right Pain - part of body: Knee     Time: 3086-5784 PT Time Calculation (min) (ACUTE ONLY): 40 min  Charges:  $Gait Training: 8-22 mins $Therapeutic Exercise: 8-22 mins $Therapeutic Activity: 8-22 mins                    Leitha Bleak, PT 05/08/19, 2:38 PM [phone]

## 2019-05-08 NOTE — Plan of Care (Signed)
No acute events this shift. Foley catheter d/c, pt. DTV @ 1300. Polar care and bone foam remains in place. HV in place with bloody output, see Epic doc. All safety measures in place, will continue to monitor. Problem: Education: Goal: Knowledge of General Education information will improve Description Including pain rating scale, medication(s)/side effects and non-pharmacologic comfort measures Outcome: Progressing   Problem: Health Behavior/Discharge Planning: Goal: Ability to manage health-related needs will improve Outcome: Progressing   Problem: Clinical Measurements: Goal: Ability to maintain clinical measurements within normal limits will improve Outcome: Progressing Goal: Will remain free from infection Outcome: Progressing Goal: Diagnostic test results will improve Outcome: Progressing Goal: Respiratory complications will improve Outcome: Progressing Goal: Cardiovascular complication will be avoided Outcome: Progressing   Problem: Activity: Goal: Risk for activity intolerance will decrease Outcome: Progressing   Problem: Nutrition: Goal: Adequate nutrition will be maintained Outcome: Progressing   Problem: Coping: Goal: Level of anxiety will decrease Outcome: Progressing   Problem: Elimination: Goal: Will not experience complications related to bowel motility Outcome: Progressing Goal: Will not experience complications related to urinary retention Outcome: Progressing   Problem: Pain Managment: Goal: General experience of comfort will improve Outcome: Progressing   Problem: Safety: Goal: Ability to remain free from injury will improve Outcome: Progressing   Problem: Skin Integrity: Goal: Risk for impaired skin integrity will decrease Outcome: Progressing   Problem: Education: Goal: Knowledge of the prescribed therapeutic regimen will improve Outcome: Progressing   Problem: Activity: Goal: Ability to avoid complications of mobility impairment will  improve Outcome: Progressing Goal: Range of joint motion will improve Outcome: Progressing   Problem: Clinical Measurements: Goal: Postoperative complications will be avoided or minimized Outcome: Progressing   Problem: Pain Management: Goal: Pain level will decrease with appropriate interventions Outcome: Progressing   Problem: Skin Integrity: Goal: Will show signs of wound healing Outcome: Progressing

## 2019-05-08 NOTE — Progress Notes (Signed)
ORTHOPAEDICS PROGRESS NOTE  PATIENT NAME: Lawrence Patterson DOB: 12-30-1950  MRN: [id number]  POD # 1: Right total knee arthroplasty  Subjective: The patient rested well last night.  Pain is been under good control.  The patient denies any nausea or vomiting. The patient did extremely well with physical therapy yesterday.  Objective: Vital signs in last 24 hours: Temp:  [97.8 F (36.6 C)-98.1 F (36.7 C)] 98 F (36.7 C) (05/26 0502) Pulse Rate:  [60-90] 73 (05/26 0502) Resp:  [15-24] 19 (05/26 0502) BP: (103-160)/(69-97) 138/78 (05/26 0502) SpO2:  [93 %-97 %] 96 % (05/26 0502)  Intake/Output from previous day: 05/25 0701 - 05/26 0700 In: 3556.5 [P.O.:1170; I.V.:1781.5; IV Piggyback:605] Out: 0964 [Urine:1450; Drains:325; Blood:50]  Recent Labs    05/07/19 0711  HGB 14.3  HCT 42.0  K 4.1  GLUCOSE 143*    EXAM General: Well-developed well-nourished male seen in no apparent discomfort. Lungs: clear to auscultation Cardiac: normal rate and regular rhythm Abdomen: Soft, nontender, nondistended.  Active bowel sounds are present. Right lower extremity: Knee dressing is dry and intact.  Hemovac drain and Polar Care are intact and functioning.  The patient is able to perform an independent straight leg raise.  Homans test is negative. Neurologic: Awake, alert, and oriented.  Sensory and motor function are intact.  Assessment: Right total knee arthroplasty  Secondary diagnoses: Pretension Diabetes Peripheral vascular disease Chronic kidney disease  Plan: Physical therapy notes were reviewed. Today's goals were reviewed with the patient.  The patient is continue with physical therapy and Occupational Therapy as per total knee arthroplasty rehab protocol. Plan is to go Home after hospital stay. DVT Prophylaxis - Lovenox, Foot Pumps and TED hose  Nayleen Janosik P. Holley Bouche M.D.

## 2019-05-08 NOTE — Evaluation (Signed)
Occupational Therapy Evaluation Patient Details Name: Lawrence Patterson MRN: [id number] DOB: 1951-03-27 Today's Date: 05/08/2019    History of Present Illness Patient is a pleasant 68 year old male s/p R TKA 05/07/19. Patient PMH includes DM, HTN, and PVD. Patient very active prior to surgery working out 4-5x/week.    Clinical Impression   Pt is 68 year old male s/p R TKA.  Pt lives at home with his wife and was independent in all ADLs prior to surgery and is eager to return to PLOF.  He was going to the gym prior to home restrictions from [place] and has stayed active with strengthening exercises at home 4-5 times a week. Pt currently requires min assist for LB dressing while in seated position due to pain and limited AROM of R knee.  Pt would benefit from instruction in dressing techniques with or without assistive devices for dressing and bathing skills.  Pt would also benefit from recommendations for home modifications to increase safety in the bathroom and prevent falls. Rec he use a shower chair with back when showering to prevent falls and he thinks he has one in the attic.  Pt most likely will not need OT HH upon DC.      Follow Up Recommendations  No OT follow up    Equipment Recommendations  (pt most likey will not need any AD for dressing skills at DC)    Recommendations for Other Services       Precautions / Restrictions Precautions Precautions: Fall Restrictions Weight Bearing Restrictions: Yes RLE Weight Bearing: Weight bearing as tolerated      Mobility Bed Mobility Overal bed mobility: Modified Independent             General bed mobility comments: Semi-supine to sit with HOB elevated; increased effort/time to perform on own; pt hooked L LE under R LE to assist with bringing to EOB.  Transfers Overall transfer level: Needs assistance Equipment used: Rolling walker (2 wheeled) Transfers: Sit to/from Stand Sit to Stand: Min guard         General transfer  comment: pt unable to stand initial attempt on own but with vc's for UE/LE placement and technique pt able to stand with CGA and increased effort    Balance Overall balance assessment: Needs assistance Sitting-balance support: No upper extremity supported;Feet supported Sitting balance-Leahy Scale: Normal Sitting balance - Comments: steady sitting reaching outside BOS   Standing balance support: No upper extremity supported Standing balance-Leahy Scale: Fair Standing balance comment: steady static standing                           ADL either performed or assessed with clinical judgement   ADL Overall ADL's : Needs assistance/impaired Eating/Feeding: Independent;Set up   Grooming: Wash/dry hands;Wash/dry face;Oral care;Applying deodorant;Brushing hair;Independent;Set up           Upper Body Dressing : Independent;Set up   Lower Body Dressing: Minimal assistance;Set up;Sit to/from stand Lower Body Dressing Details (indicate cue type and reason): cues for problem solving how to use reacher and sock aid but did well with follow through and pain only 1/10 during LB dressing activity. Good balance standing and simulating pants over hips.               General ADL Comments: Pt has not used a RW in the past and educated in safety considerations like adding a walker bag to hold cell phone, etc and not to  carry items when ambulating with FWW.      Vision Patient Visual Report: No change from baseline       Perception     Praxis      Pertinent Vitals/Pain Pain Assessment: 0-10 Pain Score: 1  Pain Location: R knee Pain Descriptors / Indicators: Sore Pain Intervention(s): Limited activity within patient's tolerance;Monitored during session;Premedicated before session     Hand Dominance Right   Extremity/Trunk Assessment Upper Extremity Assessment Upper Extremity Assessment: Overall WFL for tasks assessed   Lower Extremity Assessment Lower Extremity  Assessment: Defer to PT evaluation       Communication Communication Communication: No difficulties   Cognition Arousal/Alertness: Awake/alert Behavior During Therapy: WFL for tasks assessed/performed Overall Cognitive Status: Within Functional Limits for tasks assessed                                 General Comments: Patient A and O x 4, eager to participate in physical thearpy, has a brother who had knee replacements and wants to do better than him. HIs wife is home 24/7 if he needs help.   General Comments  R knee hemovac in place    Exercises Total Joint Exercises Ankle Circles/Pumps: AROM;Strengthening;Both;10 reps;Supine Quad Sets: AROM;Strengthening;Right;10 reps;Supine Short Arc Quad: AROM;Strengthening;Right;10 reps;Supine Heel Slides: AAROM;Strengthening;Right;10 reps;Supine Hip ABduction/ADduction: AROM;Strengthening;Right;10 reps;Supine Straight Leg Raises: AROM;Strengthening;Right;10 reps;Supine Goniometric ROM: R knee AROM 0-105 degrees   Shoulder Instructions      Home Living Family/patient expects to be discharged to:: Private residence Living Arrangements: Spouse/significant other Available Help at Discharge: Family Type of Home: House Home Access: Stairs to enter Technical brewer of Steps: 1   Home Layout: One level     Bathroom Shower/Tub: Teacher, early years/pre: Standard Bathroom Accessibility: Yes How Accessible: Accessible via walker Home Equipment: [place] - 2 wheels   Additional Comments: Patient lives at home with his wife. 1 step to enter house and one step down/up into den within home. He has a shower chair with back in the attic he can use and discussed difference b/n chiar and transfer tub bench for safety.      Prior Functioning/Environment Level of Independence: Independent        Comments: Patient is very active, working out/lifting weights 4-5 days/week. Enjoys gardening and mows lawns.          OT Problem List: Decreased strength;Decreased range of motion;Decreased activity tolerance;Pain;Decreased knowledge of use of DME or AE      OT Treatment/Interventions: Self-care/ADL training;Balance training;Patient/family education;DME and/or AE instruction    OT Goals(Current goals can be found in the care plan section) Acute Rehab OT Goals Patient Stated Goal: to return to working out and go home OT Goal Formulation: With patient Time For Goal Achievement: 05/15/19 Potential to Achieve Goals: Good ADL Goals Pt Will Perform Lower Body Dressing: with set-up;with supervision;sit to/from stand Pt Will Transfer to Toilet: with set-up;with supervision;stand pivot transfer;regular height toilet  OT Frequency: Min 1X/week   Barriers to D/C:            Co-evaluation              AM-PAC OT "6 Clicks" Daily Activity     Outcome Measure Help from another person eating meals?: None Help from another person taking care of personal grooming?: None Help from another person toileting, which includes using toliet, bedpan, or urinal?: A Little Help from another person bathing (including  washing, rinsing, drying)?: A Little Help from another person to put on and taking off regular upper body clothing?: None Help from another person to put on and taking off regular lower body clothing?: A Little 6 Click Score: 21   End of Session Equipment Utilized During Treatment: Gait belt  Activity Tolerance: Patient tolerated treatment well Patient left: in chair;with call bell/phone within reach;with chair alarm set  OT Visit Diagnosis: Pain;Unsteadiness on feet (R26.81) Pain - Right/Left: Right Pain - part of body: Knee                Time: 1045-1110 OT Time Calculation (min): 25 min Charges:  OT General Charges $OT Visit: 1 Visit OT Evaluation $OT Eval Low Complexity: 1 Low OT Treatments $Self Care/Home Management : 8-22 mins  Chrys Racer, OTR/L ascom [phone] 05/08/19, 12:02  PM

## 2019-05-09 LAB — GLUCOSE, CAPILLARY: Glucose-Capillary: 138 mg/dL — ABNORMAL HIGH (ref 70–99)

## 2019-05-09 MED ORDER — OXYCODONE HCL 5 MG PO TABS: 5.0000 mg | ORAL_TABLET | ORAL | 0 refills | Status: DC | PRN

## 2019-05-09 MED ORDER — TRAMADOL HCL 50 MG PO TABS: 50.0000 mg | ORAL_TABLET | Freq: Four times a day (QID) | ORAL | 0 refills | Status: DC | PRN

## 2019-05-09 MED ORDER — ENOXAPARIN SODIUM 40 MG/0.4ML ~~LOC~~ SOLN: 40.0000 mg | SUBCUTANEOUS | 0 refills | Status: DC

## 2019-05-09 NOTE — Discharge Summary (Signed)
Physician Discharge Summary  Patient ID: Lawrence Patterson MRN: [id number] DOB/AGE: 1951-06-30 68 y.o.  Admit date: 05/07/2019 Discharge date: 05/09/2019  Admission Diagnoses:  PRIMARY OSTEROARTHRITIS OF RIGHT KNEE   Discharge Diagnoses: Patient Active Problem List   Diagnosis Date Noted  . Benign essential hypertension 05/07/2019  . Psoriasis 05/07/2019  . Pure hypercholesterolemia 05/07/2019  . Total knee replacement status 05/07/2019  . Primary osteoarthritis of right knee 03/11/2019  . Macroalbuminuric diabetic nephropathy ([place]) 10/03/2018  . DM type 2 with diabetic mixed hyperlipidemia ([place]) 05/23/2018  . Chronic gouty arthropathy without tophi 10/11/2016    Past Medical History:  Diagnosis Date  . Arthritis   . Chronic kidney disease    increase protein/ diabetes  . Complication of anesthesia    uvular trauma.  Required a uvulectomy  . Diabetes mellitus ([place])   . Hypertension   . Proteinuria   . PVD (peripheral vascular disease) ([place])    vericosity(had surgery)     Transfusion: No transfusions during this admission   Consultants (if any): None Discharged Condition: Improved  Hospital Course: Lawrence Patterson is an 68 y.o. male who was admitted 05/07/2019 with a diagnosis of degenerative arthrosis right knee and went to the operating room on 05/07/2019 and underwent the above named procedures.    Surgeries:Procedure(s): COMPUTER ASSISTED TOTAL KNEE ARTHROPLASTY RIGHT on 05/07/2019  PRE-OPERATIVE DIAGNOSIS: Degenerative arthrosis of the right knee, primary  POST-OPERATIVE DIAGNOSIS:  Same  PROCEDURE:  Right total knee arthroplasty using computer-assisted navigation  SURGEON:  Marciano Sequin. M.D.  ASSISTANT:  Maximino Greenland, RN (present and scrubbed throughout the case, critical for assistance with exposure, retraction, instrumentation, and closure)  ANESTHESIA: spinal  ESTIMATED BLOOD LOSS: 50 mL  FLUIDS REPLACED: 800 mL of  crystalloid  TOURNIQUET TIME: 98 minutes  DRAINS: 2 medium Hemovac drains  SOFT TISSUE RELEASES: Anterior cruciate ligament, posterior cruciate ligament, deep medial collateral ligament, patellofemoral ligament  IMPLANTS UTILIZED: DePuy Attune size 7 posterior stabilized femoral component (cemented), size 8 rotating platform tibial component (cemented), 41 mm medialized dome patella (cemented), and a 5 mm stabilized rotating platform polyethylene insert.  INDICATIONS FOR SURGERY: Lawrence Patterson is a 68 y.o. year old male with a long history of progressive knee pain. X-rays demonstrated severe degenerative changes in tricompartmental fashion. The patient had not seen any significant improvement despite conservative nonsurgical intervention. After discussion of the risks and benefits of surgical intervention, the patient expressed understanding of the risks benefits and agree with plans for total knee arthroplasty.   The risks, benefits, and alternatives were discussed at length including but not limited to the risks of infection, bleeding, nerve injury, stiffness, blood clots, the need for revision surgery, cardiopulmonary complications, among others, and they were willing to proceed. Patient tolerated the surgery well. No complications .Patient was taken to PACU where she was stabilized and then transferred to the orthopedic floor.  Patient started on Lovenox 30 mg q 12 hrs. Foot pumps applied bilaterally at 80 mm hgb. Heels elevated off bed with rolled towels. No evidence of DVT. Calves non tender. Negative [place]. Physical therapy started on day #1 for gait training and transfer with OT starting on  day #1 for ADL and assisted devices. Patient has done well with therapy. Ambulated greater than 200 feet upon being discharged.  Was able to ascend and descend 4 steps safely and independently  Patient's IV And Foley were discontinued on day #1 with Hemovac being discontinued on day #2. Dressing  was changed  on day 2 prior to patient being discharged   He was given perioperative antibiotics:  Anti-infectives (From admission, onward)   Start     Dose/Rate Route Frequency Ordered Stop   05/07/19 1400  ceFAZolin (ANCEF) IVPB 2g/100 mL premix     2 g 200 mL/hr over 30 Minutes Intravenous Every 6 hours 05/07/19 1239 05/08/19 0816   05/07/19 0645  ceFAZolin (ANCEF) IVPB 2g/100 mL premix     2 g 200 mL/hr over 30 Minutes Intravenous On call to O.R. 05/07/19 2423 05/07/19 0825   05/07/19 5361  ceFAZolin (ANCEF) 2-4 GM/100ML-% IVPB    Note to Pharmacy:  Yevette Edwards   : cabinet override      05/07/19 4431 05/07/19 0810    .  He was fitted with AV 1 compression foot pump devices, instructed on heel pumps, early ambulation, and fitted with TED stockings bilaterally for DVT prophylaxis.  He benefited maximally from the hospital stay and there were no complications.    Recent vital signs:  Vitals:   05/09/19 0522 05/09/19 0731  BP: 132/74 127/76  Pulse: 78 79  Resp: 17 18  Temp: 98.2 F (36.8 C) 98 F (36.7 C)  SpO2: 94% 94%    Recent laboratory studies:  Lab Results  Component Value Date   HGB 14.3 05/07/2019   HGB 13.8 05/03/2019   HGB 13.4 02/07/2019   Lab Results  Component Value Date   WBC 6.7 05/03/2019   PLT 236 05/03/2019   Lab Results  Component Value Date   INR 1.0 05/03/2019   Lab Results  Component Value Date   NA 140 05/07/2019   K 4.1 05/07/2019   CL 106 05/03/2019   CO2 25 05/03/2019   BUN 26 (H) 05/03/2019   CREATININE 0.90 05/03/2019   GLUCOSE 143 (H) 05/07/2019    Discharge Medications:   Allergies as of 05/09/2019   No Known Allergies     Medication List    STOP taking these medications   aspirin 81 MG chewable tablet     TAKE these medications   acetaminophen 500 MG tablet Commonly known as:  TYLENOL Take 1,000 mg by mouth 2 (two) times daily as needed for mild pain or headache.   allopurinol 300 MG tablet Commonly known  as:  ZYLOPRIM Take 300 mg by mouth daily.   amLODipine 10 MG tablet Commonly known as:  NORVASC Take 10 mg by mouth daily.   carvedilol 25 MG tablet Commonly known as:  COREG Take 25 mg by mouth 2 (two) times daily with breakfast and lunch.   CoQ10 200 MG Caps Take 200 mg by mouth 2 (two) times a day.   doxazosin 4 MG tablet Commonly known as:  CARDURA Take 4 mg by mouth 2 (two) times daily.   enoxaparin 40 MG/0.4ML injection Commonly known as:  LOVENOX Inject 0.4 mLs (40 mg total) into the skin daily for 14 days. Start taking on:  May 10, 2019   gabapentin 300 MG capsule Commonly known as:  NEURONTIN Take 300 mg by mouth at bedtime.   glucosamine-chondroitin 500-400 MG tablet Take 1 tablet by mouth 2 (two) times a day.   latanoprost 0.005 % ophthalmic solution Commonly known as:  XALATAN Place 1 drop into both eyes daily.   magnesium gluconate 500 MG tablet Commonly known as:  MAGONATE Take 500 mg by mouth at bedtime.   multivitamin capsule Take 1 capsule by mouth daily.   olmesartan 20 MG tablet Commonly known as:  BENICAR Take 20 mg by mouth daily.   oxyCODONE 5 MG immediate release tablet Commonly known as:  Oxy IR/ROXICODONE Take 1 tablet (5 mg total) by mouth every 4 (four) hours as needed for moderate pain (pain score 4-6).   pravastatin 40 MG tablet Commonly known as:  PRAVACHOL Take 40 mg by mouth at bedtime.   sildenafil 20 MG tablet Commonly known as:  REVATIO Take 1 tablet by mouth daily as needed.   traMADol 50 MG tablet Commonly known as:  ULTRAM Take 1-2 tablets (50-100 mg total) by mouth every 6 (six) hours as needed for moderate pain.   Trulicity 9.21 JH/4.1DE Sopn Generic drug:  Dulaglutide Inject 0.75 mg into the skin every 7 (seven) days. Saturday   Vitamin D 50 MCG (2000 UT) Caps Take 2,000 Units by mouth daily.            Durable Medical Equipment  (From admission, onward)         Start     Ordered   05/07/19 1240   DME Walker rolling  Once    Question:  Patient needs a walker to treat with the following condition  Answer:  Total knee replacement status   05/07/19 1239   05/07/19 1240  DME Bedside commode  Once    Question:  Patient needs a bedside commode to treat with the following condition  Answer:  Total knee replacement status   05/07/19 1239          Diagnostic Studies: Dg Knee Right Port  Result Date: 05/07/2019 CLINICAL DATA:  Postop RIGHT knee EXAM: PORTABLE RIGHT KNEE - 1-2 VIEW COMPARISON:  None. FINDINGS: Surgical changes of RIGHT knee arthroplasty. Hardware appears intact and appropriately position. Drains overlie the suprapatellar bursa. Osseous alignment is anatomic. Expected postsurgical changes within the overlying soft tissues. IMPRESSION: Status post RIGHT knee arthroplasty. Hardware appears intact and appropriately positioned. No evidence of surgical complicating feature. Electronically Signed   By: Franki Cabot M.D.   On: 05/07/2019 12:26    Disposition: Discharge disposition: 01-Home or Self Care       Discharge Instructions    Increase activity slowly   Complete by:  As directed       Follow-up Information    Watt Climes, PA On 05/21/2019.   Specialty:  Physician Assistant Why:  at 10:15am Contact information: [place] [address] [phone]        Dereck Leep, MD On 06/19/2019.   Specialty:  Orthopedic Surgery Why:  at 10:00am Contact information: [place] [address] [phone]            Signed: Watt Climes 05/09/2019, 8:11 AM

## 2019-05-09 NOTE — TOC Initial Note (Signed)
Transition of Care [facility]) - Initial/Assessment Note    Patient Details  Name: Lawrence Patterson MRN: [id number] Date of Birth: 10-16-1951  [facility]) CM/SW Contact:    Su Hilt, RN Phone Number: 05/09/2019, 8:43 AM  Clinical Narrative:                  Met with the patient to discuss DC plan and needs He lives at home with his spouse and will be getting HH with [facility], Notified Helene Kelp He has a RW walker at home and has no needs for additional equipment His Lovenox is $8.28 per his insurance, I notified the patient of the cost. He states that his wife provides the transportation at this time Dr Sabra Heck is his PCP [facility] choice [facility], he can afford his medications All needs are met at this time   Expected Discharge Plan: [place] Barriers to Discharge: Barriers Resolved   Patient Goals and CMS Choice Patient states their goals for this hospitalization and ongoing recovery are:: go home CMS Medicare.gov Compare Post Acute Care list provided to:: Patient Choice offered to / list presented to : Patient  Expected Discharge Plan and Services Expected Discharge Plan: [place]         Expected Discharge Date: 05/09/19               DME Arranged: (none needed)         HH Arranged: PT HH Agency: [facility] [employer] Date [place]: 05/09/19 Time [place]: [phone] Representative spoke with at [place] Arrangements/Services                       Activities of Daily Living Home Assistive Devices/Equipment: Eyeglasses ADL Screening (condition at time of admission) Patient's cognitive ability adequate to safely complete daily activities?: Yes Is the patient deaf or have difficulty hearing?: No Does the patient have difficulty seeing, even when wearing glasses/contacts?: No Does the patient have difficulty concentrating, remembering,  or making decisions?: No Patient able to express need for assistance with ADLs?: Yes Does the patient have difficulty dressing or bathing?: No Independently performs ADLs?: Yes (appropriate for developmental age) Does the patient have difficulty walking or climbing stairs?: Yes Weakness of Legs: Right Weakness of Arms/Hands: None  Permission Sought/Granted                  Emotional Assessment              Admission diagnosis:  PRIMARY OSTEROARTHRITIS OF RIGHT KNEE Patient Active Problem List   Diagnosis Date Noted  . Benign essential hypertension 05/07/2019  . Psoriasis 05/07/2019  . Pure hypercholesterolemia 05/07/2019  . Total knee replacement status 05/07/2019  . Primary osteoarthritis of right knee 03/11/2019  . Macroalbuminuric diabetic nephropathy ([place]) 10/03/2018  . DM type 2 with diabetic mixed hyperlipidemia ([place]) 05/23/2018  . Chronic gouty arthropathy without tophi 10/11/2016   PCP:  Rusty Aus, MD Pharmacy:   [id number] [place] [place] [id number]: [phone]: [phone]    Social Determinants of Health (SDOH) Interventions    Readmission Risk Interventions No flowsheet data found.

## 2019-05-09 NOTE — Progress Notes (Signed)
I reviewed discharge instructions with patient. Patient verbalized understanding. Washed and provided new dressing for surgical incision. IV removed. Stable condition. He is now waiting to get dressed and will then call his wife to come pick him up.

## 2019-05-09 NOTE — Progress Notes (Signed)
   Subjective: 2 Days Post-Op Procedure(s) (LRB): COMPUTER ASSISTED TOTAL KNEE ARTHROPLASTY RIGHT (Right) Patient reports pain as mild.   Patient is well, and has had no acute complaints or problems Patient did very well with therapy yesterday.  Was able to meet all goals to go home. Plan is to go Home after hospital stay. no nausea and no vomiting Patient denies any chest pains or shortness of breath. Objective: Vital signs in last 24 hours: Temp:  [97.8 F (36.6 C)-98.2 F (36.8 C)] 98 F (36.7 C) (05/27 0731) Pulse Rate:  [70-79] 79 (05/27 0731) Resp:  [17-19] 18 (05/27 0731) BP: (127-134)/(73-76) 127/76 (05/27 0731) SpO2:  [94 %] 94 % (05/27 0731) well approximated incision Heels are non tender and elevated off the bed using rolled towels Intake/Output from previous day: 05/26 0701 - 05/27 0700 In: 2430.1 [P.O.:960; I.V.:1470.1] Out: 2085 [Urine:1650; Drains:435] Intake/Output this shift: No intake/output data recorded.  Recent Labs    05/07/19 0711  HGB 14.3   Recent Labs    05/07/19 0711  HCT 42.0   Recent Labs    05/07/19 0711  NA 140  K 4.1  GLUCOSE 143*   No results for input(s): LABPT, INR in the last 72 hours.  EXAM General - Patient is Alert, Appropriate and Oriented Extremity - Neurologically intact Neurovascular intact Sensation intact distally Intact pulses distally Dorsiflexion/Plantar flexion intact No cellulitis present Compartment soft Dressing - scant drainage Motor Function - intact, moving foot and toes well on exam.    Past Medical History:  Diagnosis Date  . Arthritis   . Chronic kidney disease    increase protein/ diabetes  . Complication of anesthesia    uvular trauma.  Required a uvulectomy  . Diabetes mellitus ([place])   . Hypertension   . Proteinuria   . PVD (peripheral vascular disease) ([facility])    vericosity(had surgery)    Assessment/Plan: 2 Days Post-Op Procedure(s) (LRB): COMPUTER ASSISTED TOTAL KNEE ARTHROPLASTY  RIGHT (Right) Active Problems:   Total knee replacement status  Estimated body mass index is 33.21 kg/m as calculated from the following:   Height as of this encounter: 5\' 9"  (1.753 m).   Weight as of this encounter: 102 kg. Up with therapy Discharge home with home health  Labs: None DVT Prophylaxis - Lovenox, Foot Pumps and TED hose Weight-Bearing as tolerated to right leg Hemovac was discontinued today.  Into the drain appeared to be intact  Please wash operative leg, change dressing and apply TED stockings to both legs prior to being discharged Please give the patient 2 extra honeycomb dressings to take home  [place]. [place] 05/09/2019, 8:06 AM

## 2019-05-09 NOTE — Progress Notes (Signed)
Physical Therapy Treatment Patient Details Name: Lawrence Patterson MRN: [id number] DOB: [birth year] Today's Date: 05/09/2019    History of Present Illness Patient is a pleasant 68 year old male s/p R TKA 05/07/19. Patient PMH includes DM, HTN, and PVD. Patient very active prior to surgery working out 4-5x/week.     PT Comments    Pt modified independent with bed mobility and SBA with transfers and ambulation around nursing loop with RW.  Overall pt steady and safe with mobility during session.  Pain 5/10 R knee during session.  Tolerated LE ex's semi-supine in bed well.  R knee AROM 0-110 degrees today.  Pt appears safe to discharge home with support of wife when medically appropriate (nurse notified).   Follow Up Recommendations  Home health PT     Equipment Recommendations  Rolling walker with 5" wheels;3in1 (PT)    Recommendations for Other Services OT consult     Precautions / Restrictions Precautions Precautions: Fall Restrictions Weight Bearing Restrictions: Yes RLE Weight Bearing: Weight bearing as tolerated    Mobility  Bed Mobility Overal bed mobility: Modified Independent             General bed mobility comments: Supine to/from sit with bed flat; mild increased effort/time to perform on own  Transfers Overall transfer level: Needs assistance Equipment used: Rolling walker (2 wheeled) Transfers: Sit to/from Stand Sit to Stand: Supervision         General transfer comment: pt able to stand from bed with SBA and RW use (mild increased effort to stand on own but steady) and appropriate controlled descent noted sitting in recliner  Ambulation/Gait Ambulation/Gait assistance: Supervision Gait Distance (Feet): 200 Feet Assistive device: Rolling walker (2 wheeled)   Gait velocity: mildly decreased   General Gait Details: mild decreased stance time R LE; good R heel strike; good R knee flexion during swing phase; steady   Location manager    Modified Rankin (Stroke Patients Only)       Balance Overall balance assessment: Needs assistance Sitting-balance support: No upper extremity supported;Feet supported Sitting balance-Leahy Scale: Normal Sitting balance - Comments: steady sitting reaching outside BOS   Standing balance support: No upper extremity supported Standing balance-Leahy Scale: Good Standing balance comment: steady standing reaching within BOS                            Cognition Arousal/Alertness: Awake/alert Behavior During Therapy: WFL for tasks assessed/performed Overall Cognitive Status: Within Functional Limits for tasks assessed                                        Exercises Total Joint Exercises Ankle Circles/Pumps: AROM;Strengthening;Both;10 reps;Supine Quad Sets: AROM;Strengthening;Both;10 reps;Supine Short Arc Quad: AROM;Strengthening;Right;10 reps;Supine Heel Slides: AAROM;Strengthening;Right;10 reps;Supine Hip ABduction/ADduction: AAROM;Strengthening;Right;10 reps;Supine Straight Leg Raises: AROM;Strengthening;Right;10 reps;Supine(initial vc's for quad set prior to SLR activation) Goniometric ROM: R knee AROM 0-110 degrees    General Comments  Pt agreeable to PT session.      Pertinent Vitals/Pain Pain Assessment: 0-10 Pain Score: 5  Pain Location: R knee Pain Descriptors / Indicators: Sore Pain Intervention(s): Limited activity within patient's tolerance;Monitored during session;Premedicated before session;Repositioned;Other (comment)(polar care applied and activated)    Home Living  Prior Function            PT Goals (current goals can now be found in the care plan section) Acute Rehab PT Goals Patient Stated Goal: to return to working out and go home PT Goal Formulation: With patient Time For Goal Achievement: 05/21/19 Potential to Achieve Goals: Good Progress towards PT goals:  Progressing toward goals    Frequency    BID      PT Plan Current plan remains appropriate    Co-evaluation              AM-PAC PT "6 Clicks" Mobility   Outcome Measure  Help needed turning from your back to your side while in a flat bed without using bedrails?: None Help needed moving from lying on your back to sitting on the side of a flat bed without using bedrails?: None Help needed moving to and from a bed to a chair (including a wheelchair)?: A Little Help needed standing up from a chair using your arms (e.g., wheelchair or bedside chair)?: A Little Help needed to walk in hospital room?: A Little Help needed climbing 3-5 steps with a railing? : A Little 6 Click Score: 20    End of Session Equipment Utilized During Treatment: Gait belt Activity Tolerance: Patient tolerated treatment well Patient left: in chair;with call bell/phone within reach;with chair alarm set;with SCD's reapplied;Other (comment)(polar care in place and activated) Nurse Communication: Mobility status;Precautions;Weight bearing status PT Visit Diagnosis: Unsteadiness on feet (R26.81);Other abnormalities of gait and mobility (R26.89);Muscle weakness (generalized) (M62.81);Difficulty in walking, not elsewhere classified (R26.2);Pain Pain - Right/Left: Right Pain - part of body: Knee     Time: 4818-5631 PT Time Calculation (min) (ACUTE ONLY): 40 min  Charges:  $Gait Training: 8-22 mins $Therapeutic Exercise: 8-22 mins $Therapeutic Activity: 8-22 mins                    Leitha Bleak, PT 05/09/19, 10:14 AM [phone]

## 2019-09-05 IMAGING — MR MRI OF THE RIGHT KNEE WITHOUT CONTRAST
7 series · 40 of 40 positions shown · non-contrast
Comparison: None.

CLINICAL DATA: Right knee pain for 2 years which has worsened since
December 2018. No known injury.

EXAM:
MRI OF THE RIGHT KNEE WITHOUT CONTRAST
TECHNIQUE: Multiplanar, multisequence MR imaging of the knee was performed. No
intravenous contrast was administered.

[Series 3: T2 fat-sat · axial · 4.0mm · 0.53mm/px · z∈[-78,+92]mm · 6 of 35 slices shown (1 of 3)]
[im 1/35]
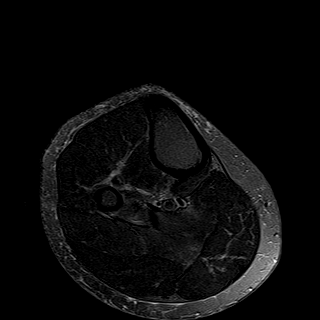
[im 7/35]
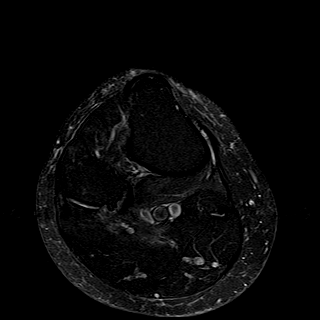
[im 14/35]
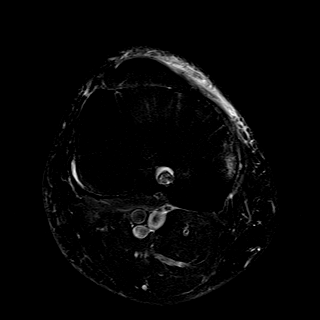
[im 21/35]
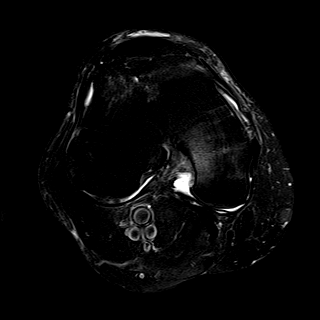
[im 28/35]
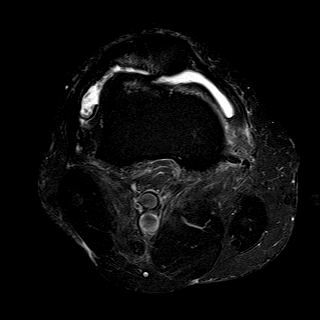
[im 35/35]
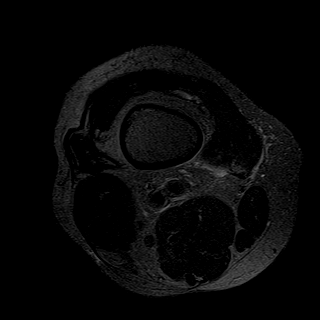

[Series 4: T1 · coronal · 4.0mm · 0.66mm/px · 5 of 31 slices shown]
[im 1/31]
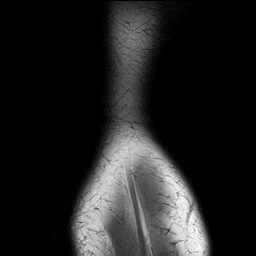
[im 8/31]
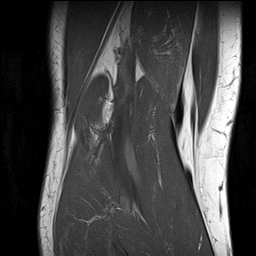
[im 16/31]
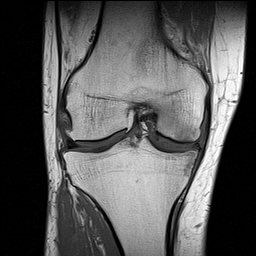
[im 23/31]
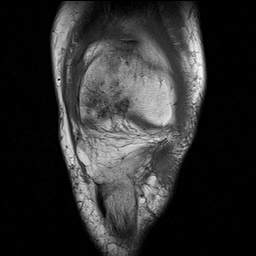
[im 31/31]
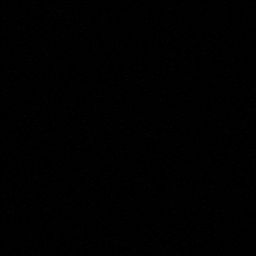

[Series 5: T2 fat-sat · coronal · 4.0mm · 0.66mm/px · 6 of 31 slices shown (2 of 3)]
[im 1/31]
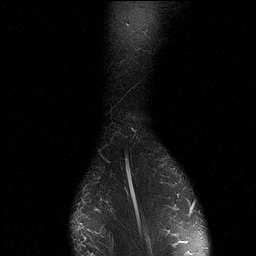
[im 7/31]
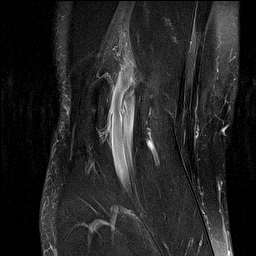
[im 13/31]
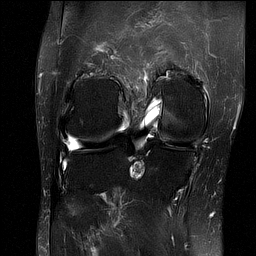
[im 19/31]
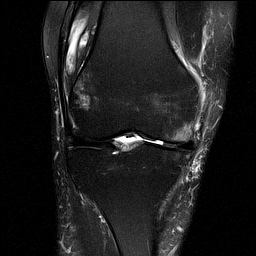
[im 25/31]
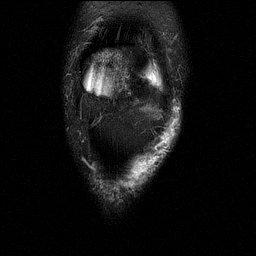
[im 31/31]
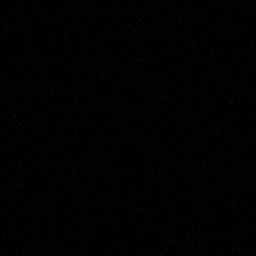

[Series 6: PD fat-sat · coronal · 4.0mm · 0.66mm/px · 6 of 31 slices shown (1 of 3)]
[im 1/31]
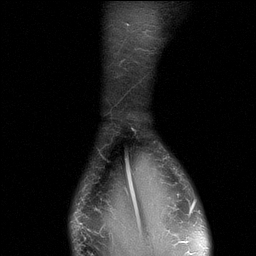
[im 7/31]
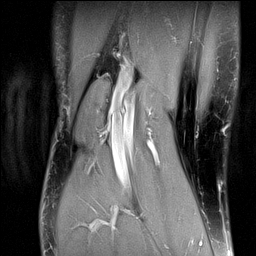
[im 13/31]
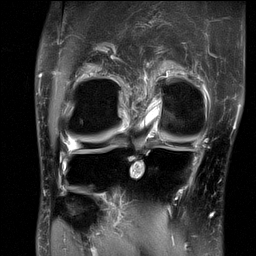
[im 19/31]
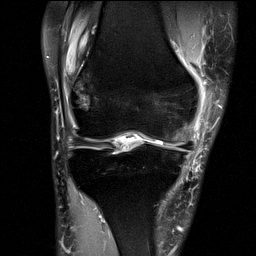
[im 25/31]
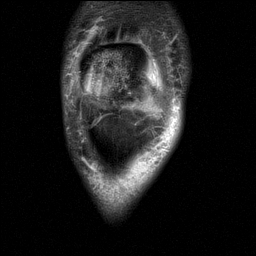
[im 31/31]
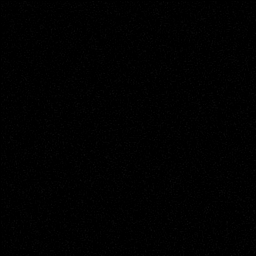

[Series 7: PD fat-sat · sagittal · 3.0mm · 0.62mm/px · 7 of 35 slices shown (2 of 3)]
[im 1/35]
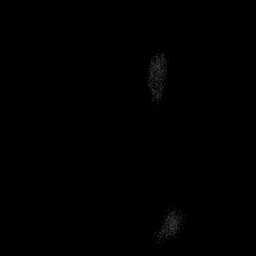
[im 6/35]
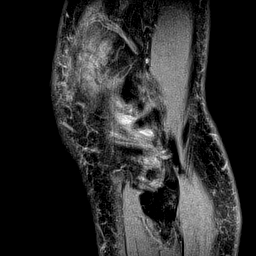
[im 12/35]
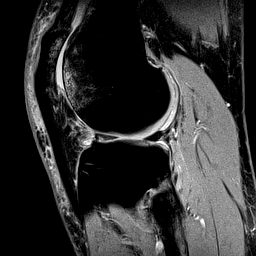
[im 18/35]
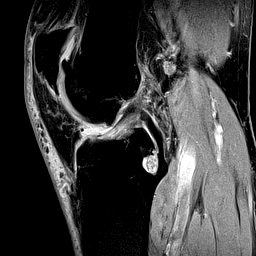
[im 23/35]
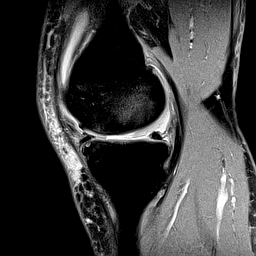
[im 29/35]
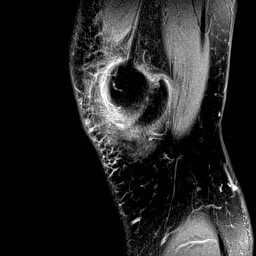
[im 35/35]
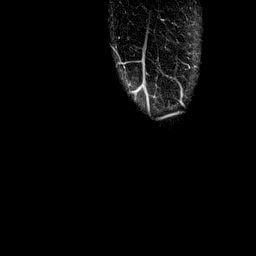

[Series 8: T2 fat-sat · sagittal · 3.0mm · 0.62mm/px · 7 of 35 slices shown (3 of 3)]
[im 1/35]
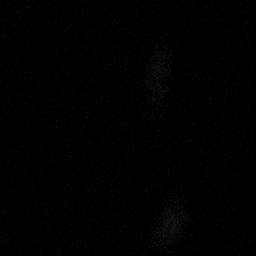
[im 6/35]
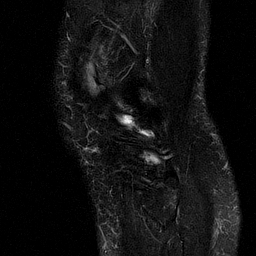
[im 12/35]
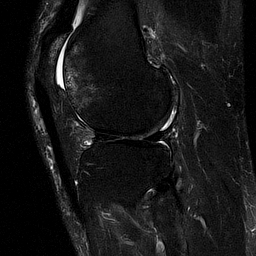
[im 18/35]
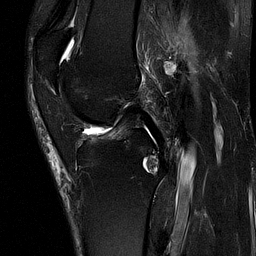
[im 23/35]
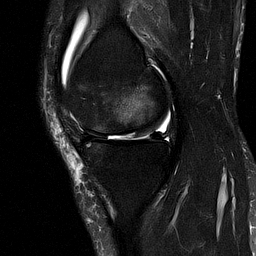
[im 29/35]
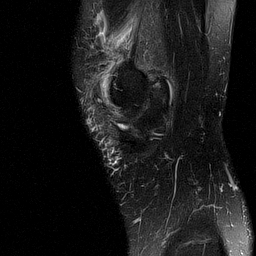
[im 35/35]
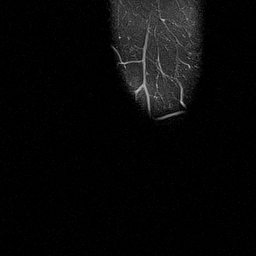

[Series 9: PD fat-sat · oblique · 2.0mm · 0.62mm/px · 3 of 15 slices shown (3 of 3)]
[im 1/15]
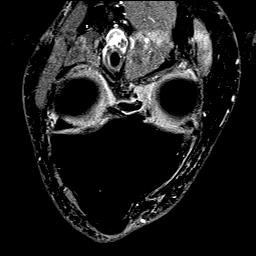
[im 8/15]
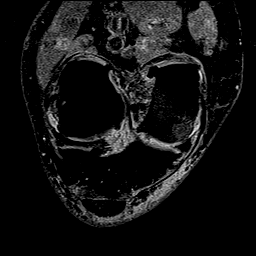
[im 15/15]
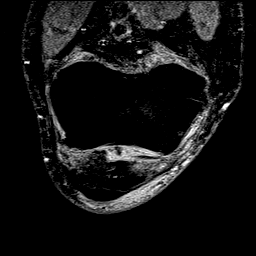

[40 of 40 positions shown; findings below may reference images not displayed]

FINDINGS: MENISCI

Medial meniscus: Complex tear in the posterior horn has both a
horizontal component reaching the meniscal undersurface and a flap
component with a meniscal fragment displaced just medial to the root
of the posterior horn.

Lateral meniscus:  Intact.

LIGAMENTS

Cruciates:  Intact.  There is mucoid degeneration of the ACL.

Collaterals:  Intact.

CARTILAGE

Patellofemoral: Severe cartilage loss is present diffusely about the
patella and trochlea, somewhat worse on the lateral side.

Medial: Full-thickness cartilage defect along the central
weight-bearing medial femoral condyle measures 1.3 cm transverse by
1.7 cm AP.

Lateral:  Mildly degenerated.

Joint:  Small effusion.

Popliteal Fossa:  No Baker's cyst.

Extensor Mechanism:  Intact.

Bones: Osteophytosis about the knee is worst in patellofemoral
compartment. There is extensive subchondral edema in both the
lateral patellar facet and lateral femoral trochlea. Marrow edema
about the medial compartment is worst in the medial femoral condyle.
Degenerative cyst in the tibia at the PCL attachment site is noted.

Other: Moderately severe proximal tib-fib joint osteoarthritis is
seen with subchondral edema and cyst formation in the fibular head.
IMPRESSION: Dominant finding is markedly advanced for age osteoarthritis which
is worst in the patellofemoral and medial compartments.

Complex tear posterior horn medial meniscus as described above.

Moderately severe osteoarthritis proximal tib-fib joint.

## 2020-01-02 ENCOUNTER — Ambulatory Visit: Payer: Medicare Other | Attending: Internal Medicine

## 2020-01-02 DIAGNOSIS — Z23 Encounter for immunization: Secondary | ICD-10-CM

## 2020-01-02 NOTE — Progress Notes (Signed)
   Covid-19 Vaccination Clinic  Name:  Lawrence Patterson    MRN: [id number] DOB: [birth year]  01/02/2020  [name] was observed post Covid-19 immunization for 15 minutes without incidence. He was provided with Vaccine Information Sheet and instruction to access the V-Safe system.   [name] was instructed to call 911 with any severe reactions post vaccine: Marland Kitchen Difficulty breathing  . Swelling of your face and throat  . A fast heartbeat  . A bad rash all over your body  . Dizziness and weakness    Immunizations Administered    Name Date Dose VIS Date Route   Pfizer COVID-19 Vaccine 01/02/2020  4:31 PM 0.3 mL 11/23/2019 Intramuscular   Manufacturer: [place]   Lot: [id number]   [place]: [id number]

## 2020-01-23 ENCOUNTER — Ambulatory Visit: Payer: Medicare Other | Attending: Internal Medicine

## 2020-01-23 DIAGNOSIS — Z23 Encounter for immunization: Secondary | ICD-10-CM | POA: Insufficient documentation

## 2020-01-23 NOTE — Progress Notes (Signed)
   Covid-19 Vaccination Clinic  Name:  Lawrence Patterson    MRN: [id number] DOB: 04-30-1951  01/23/2020  Lawrence Patterson was observed post Covid-19 immunization for 15 minutes without incidence. He was provided with Vaccine Information Sheet and instruction to access the V-Safe system.   Lawrence Patterson was instructed to call 911 with any severe reactions post vaccine: Marland Kitchen Difficulty breathing  . Swelling of your face and throat  . A fast heartbeat  . A bad rash all over your body  . Dizziness and weakness    Immunizations Administered    Name Date Dose VIS Date Route   Pfizer COVID-19 Vaccine 01/23/2020  8:07 AM 0.3 mL 11/23/2019 Intramuscular   Manufacturer: [place]   Lot: [id number]   [place]: [id number]

## 2020-03-23 ENCOUNTER — Other Ambulatory Visit: Payer: Self-pay

## 2020-03-23 ENCOUNTER — Emergency Department: Payer: Medicare Other

## 2020-03-23 ENCOUNTER — Emergency Department
Admission: EM | Admit: 2020-03-23 | Discharge: 2020-03-23 | Disposition: A | Discharge status: Home / Self Care | Payer: Medicare Other | Attending: Student in an Organized Health Care Education/Training Program | Admitting: Student in an Organized Health Care Education/Training Program

## 2020-03-23 DIAGNOSIS — Z79899 Other long term (current) drug therapy: Secondary | ICD-10-CM | POA: Diagnosis not present

## 2020-03-23 DIAGNOSIS — Y929 Unspecified place or not applicable: Secondary | ICD-10-CM | POA: Diagnosis not present

## 2020-03-23 DIAGNOSIS — E1122 Type 2 diabetes mellitus with diabetic chronic kidney disease: Secondary | ICD-10-CM | POA: Insufficient documentation

## 2020-03-23 DIAGNOSIS — S43421A Sprain of right rotator cuff capsule, initial encounter: Secondary | ICD-10-CM | POA: Diagnosis not present

## 2020-03-23 DIAGNOSIS — I129 Hypertensive chronic kidney disease with stage 1 through stage 4 chronic kidney disease, or unspecified chronic kidney disease: Secondary | ICD-10-CM | POA: Diagnosis not present

## 2020-03-23 DIAGNOSIS — W01198A Fall on same level from slipping, tripping and stumbling with subsequent striking against other object, initial encounter: Secondary | ICD-10-CM | POA: Insufficient documentation

## 2020-03-23 DIAGNOSIS — Z96651 Presence of right artificial knee joint: Secondary | ICD-10-CM | POA: Diagnosis not present

## 2020-03-23 DIAGNOSIS — N189 Chronic kidney disease, unspecified: Secondary | ICD-10-CM | POA: Diagnosis not present

## 2020-03-23 DIAGNOSIS — Y93H9 Activity, other involving exterior property and land maintenance, building and construction: Secondary | ICD-10-CM | POA: Diagnosis not present

## 2020-03-23 DIAGNOSIS — Z7984 Long term (current) use of oral hypoglycemic drugs: Secondary | ICD-10-CM | POA: Diagnosis not present

## 2020-03-23 DIAGNOSIS — Y999 Unspecified external cause status: Secondary | ICD-10-CM | POA: Insufficient documentation

## 2020-03-23 DIAGNOSIS — T148XXA Other injury of unspecified body region, initial encounter: Secondary | ICD-10-CM

## 2020-03-23 DIAGNOSIS — S0990XA Unspecified injury of head, initial encounter: Secondary | ICD-10-CM

## 2020-03-23 NOTE — ED Triage Notes (Signed)
Pt states he was trying to pull a limb that was hanging out of a tree. Limb came out of tree and hit pt knocking him down on concrete. C/o R shoulder pain. Denies LOC. Denies HA. Abrasion noted to posterior head. Takes ASA but no blood thinners. Bleeding controlled. A&O, ambulatory. Talking in complete sentences. Denies blurred vision. Hx rotator cuff surgery to R shoulder. Able to move it but painful.

## 2020-03-23 NOTE — ED Provider Notes (Signed)
[facility] EMERGENCY DEPARTMENT Provider Note   CSN: [id number] Arrival date & time: 03/23/20  1457     History Chief Complaint  Patient presents with  . Fall    Lawrence Patterson is a 69 y.o. male presents to the emergency department for evaluation of fall.  He was pulling on a tree limb from the ground, tree limb broke, he fell backwards, hitting his head on concrete.  Has abrasion with slight headache.  Does not feel as if he lost consciousness but was dazed.  No vision changes, nausea, vomiting.  No neck pain numbness tingling radicular symptoms.  He did not fall on his right shoulder but while twisting and torquing the limb developed some right shoulder pain.  History of rotator cuff repair 6 years ago.  He has pain with abduction at 90 degrees flexion.  He also states earlier while pulling on the limb he pulled a oblique muscle on his left lower back.  No numbness tingling radicular symptoms.  No midline back tenderness.  Pain in the shoulder is mild 1 out of 10, can be more severe with attempted abduction.  Headache is mild, 1 out of 10.  HPI     Past Medical History:  Diagnosis Date  . Arthritis   . Chronic kidney disease    increase protein/ diabetes  . Complication of anesthesia    uvular trauma.  Required a uvulectomy  . Diabetes mellitus ([place])   . Hypertension   . Proteinuria   . PVD (peripheral vascular disease) [facility])    vericosity(had surgery)    Patient Active Problem List   Diagnosis Date Noted  . Benign essential hypertension 05/07/2019  . Psoriasis 05/07/2019  . Pure hypercholesterolemia 05/07/2019  . Total knee replacement status 05/07/2019  . Primary osteoarthritis of right knee 03/11/2019  . Macroalbuminuric diabetic nephropathy ([place]) 10/03/2018  . 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 Chronic gouty arthropathy without tophi 10/11/2016    Past Surgical History:  Procedure Laterality Date  . KNEE  ARTHROPLASTY Right 05/07/2019   Procedure: COMPUTER ASSISTED TOTAL KNEE ARTHROPLASTY RIGHT;  Surgeon: Dereck Leep, MD;  Location: [facility] ORS;  Service: Orthopedics;  Laterality: Right;  . KNEE ARTHROSCOPY Left   . PILONIDAL CYST EXCISION    . PILONIDAL CYST EXCISION     x2  . ROTATOR CUFF REPAIR Right   . TONSILLECTOMY    . UVULECTOMY    . VARICOSE VEIN SURGERY Left 1992  . VASECTOMY  1990  . VEIN SURGERY     laser       History reviewed. No pertinent family history.  Social History   Tobacco Use  . Smoking status: Never Smoker  . Smokeless tobacco: Current User  Substance Use Topics  . Alcohol use: Never  . Drug use: Never    Home Medications Prior to Admission medications   Medication Sig Start Date End Date Taking? Authorizing Provider  acetaminophen (TYLENOL) 500 MG tablet Take 1,000 mg by mouth 2 (two) times daily as needed for mild pain or headache.    [provider]  allopurinol (ZYLOPRIM) 300 MG tablet Take 300 mg by mouth daily.    [provider]  amLODipine (NORVASC) 10 MG tablet Take 10 mg by mouth daily.    [provider]  carvedilol (COREG) 25 MG tablet Take 25 mg by mouth 2 (two) times daily with breakfast and lunch. 02/25/19   [provider]  Cholecalciferol (VITAMIN D)  50 MCG (2000 UT) CAPS Take 2,000 Units by mouth daily.    [provider]  Coenzyme Q10 (COQ10) 200 MG CAPS Take 200 mg by mouth 2 (two) times a day.    [provider]  doxazosin (CARDURA) 4 MG tablet Take 4 mg by mouth 2 (two) times daily.    [provider]  enoxaparin (LOVENOX) 40 MG/0.4ML injection Inject 0.4 mLs (40 mg total) into the skin daily for 14 days. 05/10/19 05/24/19  Watt Climes, PA  gabapentin (NEURONTIN) 300 MG capsule Take 300 mg by mouth at bedtime. 02/02/19   [provider]  glucosamine-chondroitin 500-400 MG tablet Take 1 tablet by mouth 2 (two) times a day.     [provider]  latanoprost  (XALATAN) 0.005 % ophthalmic solution Place 1 drop into both eyes daily.     [provider]  magnesium gluconate (MAGONATE) 500 MG tablet Take 500 mg by mouth at bedtime.    [provider]  Multiple Vitamin (MULTIVITAMIN) capsule Take 1 capsule by mouth daily.    [provider]  olmesartan (BENICAR) 20 MG tablet Take 20 mg by mouth daily. 12/16/18   [provider]  oxyCODONE (OXY IR/ROXICODONE) 5 MG immediate release tablet Take 1 tablet (5 mg total) by mouth every 4 (four) hours as needed for moderate pain (pain score 4-6). 05/09/19   Watt Climes, PA  pravastatin (PRAVACHOL) 40 MG tablet Take 40 mg by mouth at bedtime. 12/15/18   [provider]  sildenafil (REVATIO) 20 MG tablet Take 1 tablet by mouth daily as needed.    [provider]  traMADol (ULTRAM) 50 MG tablet Take 1-2 tablets (50-100 mg total) by mouth every 6 (six) hours as needed for moderate pain. 05/09/19   Watt Climes, PA  TRULICITY [id number] SOPN Inject 0.75 mg into the skin every 7 (seven) days. Saturday 02/27/19   [provider]    Allergies    Patient has no known allergies.  Review of Systems   Review of Systems  Constitutional: Negative for chills, fatigue and fever.  Cardiovascular: Negative for chest pain and leg swelling.  Gastrointestinal: Negative for nausea and vomiting.  Musculoskeletal: Positive for arthralgias and back pain. Negative for neck pain.  Neurological: Positive for headaches. Negative for dizziness, tremors, weakness and numbness.    Physical Exam Updated Vital Signs BP 130/79   Pulse 73   Temp 98.5 F (36.9 C) (Oral)   Resp 16   Ht 5\' 9"  (1.753 m)   Wt 102.1 kg   SpO2 96%   BMI 33.23 kg/m   Physical Exam Constitutional:      Appearance: He is well-developed.  HENT:     Head: Normocephalic.     Comments: Slight abrasion to the occipital region of the scalp, no soft tissue swelling or tenderness. Eyes:     Extraocular  Movements: Extraocular movements intact.     Conjunctiva/sclera: Conjunctivae normal.     Pupils: Pupils are equal, round, and reactive to light.  Cardiovascular:     Rate and Rhythm: Normal rate.  Pulmonary:     Effort: Pulmonary effort is normal. No respiratory distress.  Abdominal:     General: There is no distension.     Tenderness: There is no abdominal tenderness.  Musculoskeletal:     Cervical back: Normal range of motion.     Comments: Right shoulder with active abduction to 70 degrees, passively increased to 120.  He has pain  at 90 degrees of abduction.  Positive Hawkins and impingement test.  No neurological deficits in the right upper extremity.  Nontender along the cervical thoracic or lumbar spinous process.  Minimal tenderness to left oblique muscle along the paravertebral muscles of the lumbar spine.  Good range of motion of the hips with no discomfort.  Skin:    General: Skin is warm.     Findings: No rash.  Neurological:     General: No focal deficit present.     Mental Status: He is alert and oriented to person, place, and time.     Cranial Nerves: No cranial nerve deficit.     Sensory: No sensory deficit.     Motor: No weakness.     Gait: Gait normal.  Psychiatric:        Mood and Affect: Mood normal.        Behavior: Behavior normal.        Thought Content: Thought content normal.     ED Results / Procedures / Treatments   Labs (all labs ordered are listed, but only abnormal results are displayed) Labs Reviewed - No data to display  EKG None  Radiology DG Shoulder Right  Result Date: 03/23/2020 CLINICAL DATA:  Right shoulder pain and limited range of motion after fall EXAM: RIGHT SHOULDER - 2+ VIEW COMPARISON:  None. FINDINGS: Surgical anchor in the greater tuberosity. There is no evidence of fracture or dislocation. Os acromiale. Mild glenohumeral and acromioclavicular arthropathy. Soft tissues are unremarkable. IMPRESSION: No acute osseous abnormality,  right shoulder. Electronically Signed   By: Davina Poke D.O.   On: 03/23/2020 17:37   CT Head Wo Contrast  Result Date: 03/23/2020 CLINICAL DATA:  Headache after head trauma. Abrasion EXAM: CT HEAD WITHOUT CONTRAST TECHNIQUE: Contiguous axial images were obtained from the base of the skull through the vertex without intravenous contrast. COMPARISON:  None. FINDINGS: Brain: No evidence of acute infarction, hemorrhage, hydrocephalus, extra-axial collection or mass lesion/mass effect. Mild age related cerebral volume loss. Vascular: Atherosclerotic calcifications involving the large vessels of the skull base. No unexpected hyperdense vessel. Skull: Negative for calvarial fracture. Sinuses/Orbits: No acute finding. Other: Mild focal soft tissue swelling over the high left parietal region. IMPRESSION: 1. No acute intracranial abnormality. 2. Mild focal soft tissue swelling over the high left parietal region. No underlying calvarial fracture. Electronically Signed   By: Davina Poke D.O.   On: 03/23/2020 17:31    Procedures Procedures (including critical care time)  Medications Ordered in ED Medications - No data to display  ED Course  I have reviewed the triage vital signs and the nursing notes.  Pertinent labs & imaging results that were available during my care of the patient were reviewed by me and considered in my medical decision making (see chart for details).    MDM Rules/Calculators/A&P                      69 year old male with fall, head injury, right shoulder pain and low back pain.  Low back pain consistent with muscle strain.  Shoulder pain consistent with strain, discussed possible rotator cuff tear but x-rays are negative for fracture.  He will follow-up with orthopedics.  Head injury, benign, CT of the head negative.  Has very little head discomfort.  No nausea or vomiting.  Overall feels fine.  No neck pain or radicular symptoms.  We discussed NSAIDs versus steroids but due  to diabetes and previous creatinine labs will hold  both.  Recommend Tylenol.  He has tramadol and will take this as needed.  He understands signs and symptoms return to ED for. Final Clinical Impression(s) / ED Diagnoses Final diagnoses:  Injury of head, initial encounter  Sprain of right rotator cuff capsule, initial encounter  Muscle strain    Rx / DC Orders ED Discharge Orders    None       Renata Caprice 03/23/20 1851    Merlyn Lot, MD 03/24/20 1538

## 2020-03-23 NOTE — Discharge Instructions (Addendum)
Please take tramadol, 1 to 2 tablets every 6 hours as needed.  Apply ice to the right shoulder 20 minutes every hour for the next 2 days.  Take Tylenol 1000 mg every 6 hours as needed.  Follow-up with orthopedics in 1 week if no improvement.  Return to the ER for any severe headache, nausea, vomiting or vision changes

## 2020-03-23 NOTE — ED Triage Notes (Signed)
Pt states he was pulling on a limb and it gave way causing him to fall back hitting his head on the side walk, pt has a lac to the posterior head with controlled bleeding and is having pain to the right shoulder with decreased movement.

## 2020-03-23 NOTE — ED Notes (Addendum)
See triage note, pt reports falling while trying to pull a limb out of a tree this afternoon. Reports hitting head on concrete sidewalk. Reports "seeing stars" but denies LOC.  Takes 81mg  aspirin daily.  abrasion noted to back of head. Denies neck pain Reports pain in right shoulder.  Pt alert and oriented x4. Reports mild headache that started after fall

## 2020-03-28 ENCOUNTER — Other Ambulatory Visit: Payer: Self-pay | Admitting: Student

## 2020-03-28 DIAGNOSIS — Z9889 Other specified postprocedural states: Secondary | ICD-10-CM

## 2020-03-28 DIAGNOSIS — S46011A Strain of muscle(s) and tendon(s) of the rotator cuff of right shoulder, initial encounter: Secondary | ICD-10-CM

## 2020-03-31 ENCOUNTER — Ambulatory Visit
Admission: RE | Admit: 2020-03-31 | Discharge: 2020-03-31 | Disposition: A | Discharge status: Home / Self Care | Payer: Medicare Other | Source: Ambulatory Visit | Attending: Student | Admitting: Student

## 2020-03-31 ENCOUNTER — Other Ambulatory Visit: Payer: Self-pay

## 2020-03-31 DIAGNOSIS — Z9889 Other specified postprocedural states: Secondary | ICD-10-CM | POA: Diagnosis present

## 2020-03-31 DIAGNOSIS — S46011A Strain of muscle(s) and tendon(s) of the rotator cuff of right shoulder, initial encounter: Secondary | ICD-10-CM | POA: Insufficient documentation

## 2020-09-03 ENCOUNTER — Other Ambulatory Visit: Payer: Self-pay | Admitting: Surgery

## 2020-09-08 ENCOUNTER — Ambulatory Visit (INDEPENDENT_AMBULATORY_CARE_PROVIDER_SITE_OTHER): Payer: Medicare Other | Admitting: Dermatology

## 2020-09-08 ENCOUNTER — Encounter: Payer: Self-pay | Admitting: Dermatology

## 2020-09-08 ENCOUNTER — Other Ambulatory Visit: Payer: Self-pay

## 2020-09-08 DIAGNOSIS — L820 Inflamed seborrheic keratosis: Secondary | ICD-10-CM

## 2020-09-08 DIAGNOSIS — L821 Other seborrheic keratosis: Secondary | ICD-10-CM

## 2020-09-08 DIAGNOSIS — L720 Epidermal cyst: Secondary | ICD-10-CM | POA: Diagnosis not present

## 2020-09-08 DIAGNOSIS — D1800 Hemangioma unspecified site: Secondary | ICD-10-CM | POA: Diagnosis not present

## 2020-09-08 DIAGNOSIS — Z1283 Encounter for screening for malignant neoplasm of skin: Secondary | ICD-10-CM | POA: Diagnosis not present

## 2020-09-08 DIAGNOSIS — D485 Neoplasm of uncertain behavior of skin: Secondary | ICD-10-CM | POA: Diagnosis not present

## 2020-09-08 DIAGNOSIS — L570 Actinic keratosis: Secondary | ICD-10-CM

## 2020-09-08 DIAGNOSIS — D229 Melanocytic nevi, unspecified: Secondary | ICD-10-CM

## 2020-09-08 DIAGNOSIS — L814 Other melanin hyperpigmentation: Secondary | ICD-10-CM

## 2020-09-08 DIAGNOSIS — C4491 Basal cell carcinoma of skin, unspecified: Secondary | ICD-10-CM

## 2020-09-08 DIAGNOSIS — L578 Other skin changes due to chronic exposure to nonionizing radiation: Secondary | ICD-10-CM

## 2020-09-08 HISTORY — DX: Basal cell carcinoma of skin, unspecified: C44.91

## 2020-09-08 NOTE — Patient Instructions (Signed)

## 2020-09-08 NOTE — Progress Notes (Signed)
Follow-Up Visit   Subjective  Lawrence Patterson is a 69 y.o. male who presents for the following: Annual Exam (History of AKs - TBSE today) and Other (Spots of right lower eyelid and left infraocular that are irritating.). The patient presents for Total-Body Skin Exam (TBSE) for skin cancer screening and mole check.  The following portions of the chart were reviewed this encounter and updated as appropriate:  Tobacco  Allergies  Meds  Problems  Med Hx  Surg Hx  Fam Hx     Review of Systems:  No other skin or systemic complaints except as noted in HPI or Assessment and Plan.  Objective  Well appearing patient in no apparent distress; mood and affect are within normal limits.  A full examination was performed including scalp, head, eyes, ears, nose, lips, neck, chest, axillae, abdomen, back, buttocks, bilateral upper extremities, bilateral lower extremities, hands, feet, fingers, toes, fingernails, and toenails. All findings within normal limits unless otherwise noted below.  Objective  Right Lower Eyelid : Erythematous keratotic or waxy stuck-on papule or plaque.   Objective  Left Lower Eyelid : 0.6 cm pearly papule       Objective  Left Lower Eyelid : Smooth white papule(s).   Objective  Scalp (16): Erythematous thin papules/macules with gritty scale.    Assessment & Plan    Lentigines - Scattered tan macules - Discussed due to sun exposure - Benign, observe - Call for any changes  Seborrheic Keratoses - Stuck-on, waxy, tan-brown papules and plaques  - Discussed benign etiology and prognosis. - Observe - Call for any changes  Melanocytic Nevi - Tan-brown and/or pink-flesh-colored symmetric macules and papules - Benign appearing on exam today - Observation - Call clinic for new or changing moles - Recommend daily use of broad spectrum spf 30+ sunscreen to sun-exposed areas.   Hemangiomas - Red papules - Discussed benign nature - Observe - Call for  any changes  Actinic Damage - diffuse scaly erythematous macules with underlying dyspigmentation - Recommend daily broad spectrum sunscreen SPF 30+ to sun-exposed areas, reapply every 2 hours as needed.  - Call for new or changing lesions.  Skin cancer screening performed today.  Inflamed seborrheic keratosis Right Lower Eyelid   Destruction of lesion - Right Lower Eyelid  Complexity: simple   Destruction method: cryotherapy   Informed consent: discussed and consent obtained   Timeout:  patient name, date of birth, surgical site, and procedure verified Lesion destroyed using liquid nitrogen: Yes   Region frozen until ice ball extended beyond lesion: Yes   Outcome: patient tolerated procedure well with no complications   Post-procedure details: wound care instructions given    Neoplasm of uncertain behavior of skin Left Lower Eyelid   Skin / nail biopsy Type of biopsy: tangential   Informed consent: discussed and consent obtained   Timeout: patient name, date of birth, surgical site, and procedure verified   Procedure prep:  Patient was prepped and draped in usual sterile fashion Prep type:  Isopropyl alcohol Anesthesia: the lesion was anesthetized in a standard fashion   Anesthetic:  1% lidocaine w/ epinephrine 1-100,000 buffered w/ 8.4% NaHCO3 Instrument used: flexible razor blade   Hemostasis achieved with: pressure, aluminum chloride and electrodesiccation   Outcome: patient tolerated procedure well   Post-procedure details: sterile dressing applied and wound care instructions given   Dressing type: bandage and petrolatum    Specimen 1 - Surgical pathology Differential Diagnosis: BCC vs other  Check Margins: No 0.6 cm  pearly papule  [place] Left Lower Eyelid  Benign  AK (actinic keratosis) (16) Scalp Destruction of lesion - Scalp Complexity: simple   Destruction method: cryotherapy   Informed consent: discussed and consent obtained   Timeout:  patient name, date  of birth, surgical site, and procedure verified Lesion destroyed using liquid nitrogen: Yes   Region frozen until ice ball extended beyond lesion: Yes   Outcome: patient tolerated procedure well with no complications   Post-procedure details: wound care instructions given    Return in about 5 months (around 02/08/2021) for AK follow up.   I, Ashok Cordia, CMA, am acting as scribe for Sarina Ser, MD .  Documentation: I have reviewed the above documentation for accuracy and completeness, and I agree with the above.  Sarina Ser, MD

## 2020-09-15 ENCOUNTER — Other Ambulatory Visit: Payer: Self-pay

## 2020-09-15 ENCOUNTER — Telehealth: Payer: Self-pay

## 2020-09-15 DIAGNOSIS — C441192 Basal cell carcinoma of skin of left lower eyelid, including canthus: Secondary | ICD-10-CM

## 2020-09-15 NOTE — Telephone Encounter (Signed)
-----   Message from Ralene Bathe, MD sent at 09/15/2020 12:05 PM EDT ----- Skin , left lower eyelid BASAL CELL CARCINOMA, NODULAR AND INFILTRATIVE PATTERNS  Cancer - BCC Infiltrative Schedule for MOHS

## 2020-09-15 NOTE — Progress Notes (Signed)
Referral for [facility] sent to [facility].

## 2020-09-15 NOTE — Telephone Encounter (Signed)
LM on VM please return my call  

## 2020-09-15 NOTE — Telephone Encounter (Signed)
Pt informed of results and referral for [facility] placed.

## 2020-09-22 ENCOUNTER — Encounter
Admission: RE | Admit: 2020-09-22 | Discharge: 2020-09-22 | Disposition: A | Discharge status: Home / Self Care | Payer: Medicare Other | Source: Ambulatory Visit | Attending: Surgery | Admitting: Surgery

## 2020-09-22 ENCOUNTER — Other Ambulatory Visit: Payer: Self-pay

## 2020-09-22 DIAGNOSIS — Z01818 Encounter for other preprocedural examination: Secondary | ICD-10-CM | POA: Insufficient documentation

## 2020-09-22 DIAGNOSIS — E119 Type 2 diabetes mellitus without complications: Secondary | ICD-10-CM | POA: Insufficient documentation

## 2020-09-22 DIAGNOSIS — I10 Essential (primary) hypertension: Secondary | ICD-10-CM | POA: Insufficient documentation

## 2020-09-22 NOTE — Patient Instructions (Signed)
Your procedure is scheduled on: Thursday 10/02/20.  Report to DAY SURGERY DEPARTMENT LOCATED ON 2ND FLOOR MEDICAL MALL ENTRANCE. To find out your arrival time please call [phone] between 1PM - 3PM on Wednesday 10/01/20.   Remember: Instructions that are not followed completely may result in serious medical risk, up to and including death, or upon the discretion of your surgeon and anesthesiologist your surgery may need to be rescheduled.     __X__ 1. Do not eat food after midnight the night before your procedure.                 No gum chewing or hard candies. You may drink SUGAR FREE clear liquids up to 2 hours                 before you are scheduled to arrive for your surgery- DO NOT drink clear                 liquids within 2 hours of the start of your surgery.                   __X__2.  On the morning of surgery brush your teeth with toothpaste and water, you may rinse your mouth with mouthwash if you wish.  Do not swallow any toothpaste or mouthwash.    __X__ 3.  No Alcohol for 24 hours before or after surgery.  __X__ 4.  Do Not Smoke or use e-cigarettes For 24 Hours Prior to Your Surgery.                 Do not use any chewable tobacco products for at least 6 hours prior to                 surgery.  __X__5.  Notify your doctor if there is any change in your medical condition      (cold, fever, infections).      Do NOT wear jewelry, make-up, hairpins, clips or nail polish. Do NOT wear lotions, powders, or perfumes.  Do NOT shave 48 hours prior to surgery. Men may shave face and neck. Do NOT bring valuables to the hospital.     [facility] is not responsible for any belongings or valuables.   Contacts, dentures/partials or body piercings may not be worn into surgery. Bring a case for your contacts, glasses or hearing aids, a denture cup will be supplied.    Patients discharged the day of surgery will not be allowed to drive home.     __X__ Take these medicines  the morning of surgery with A SIP OF WATER:     1. acetaminophen (TYLENOL)   2. allopurinol (ZYLOPRIM)   3. amLODipine (NORVASC)  4. carvedilol (COREG)  5. latanoprost (XALATAN)     __X__ Use CHG Soap as directed   __X__ Stop Blood Thinners: Aspirin. Your last dose will be on Sunday 09/28/20.  __X__ Stop Anti-inflammatories 7 days before surgery such as Advil, Ibuprofen, Motrin, BC or Goodies Powder, Naprosyn, Naproxen, Aleve, Aspirin, Meloxicam. May take Tylenol if needed for pain or discomfort.   __X__Do not start taking any new herbal supplements or vitamins prior to your procedure.  __X__ Stop the following herbal supplements or vitamins:  Coenzyme Q10   Zinc Methionate    Wear comfortable clothing (specific to your surgery type) to the hospital.  Plan for stool softeners for home use; pain medications have a tendency to cause constipation. You can also help prevent constipation  by eating foods high in fiber such as fruits and vegetables and drinking plenty of fluids as your diet allows.  After surgery, you can prevent lung complications by doing breathing exercises.Take deep breaths and cough every 1-2 hours. Your doctor may order a device called an Incentive Spirometer to help you take deep breaths.  Please call the [place] Department at [phone] if you have any questions about these instructions.

## 2020-09-30 ENCOUNTER — Other Ambulatory Visit
Admission: RE | Admit: 2020-09-30 | Discharge: 2020-09-30 | Disposition: A | Discharge status: Home / Self Care | Payer: Medicare Other | Source: Ambulatory Visit | Attending: Surgery | Admitting: Surgery

## 2020-09-30 ENCOUNTER — Other Ambulatory Visit: Payer: Self-pay

## 2020-09-30 DIAGNOSIS — Z01812 Encounter for preprocedural laboratory examination: Secondary | ICD-10-CM | POA: Diagnosis present

## 2020-09-30 DIAGNOSIS — Z20822 Contact with and (suspected) exposure to covid-19: Secondary | ICD-10-CM | POA: Insufficient documentation

## 2020-09-30 LAB — SARS CORONAVIRUS 2 (TAT 6-24 HRS): SARS Coronavirus 2: NEGATIVE

## 2020-10-01 MED ORDER — CHLORHEXIDINE GLUCONATE 0.12 % MT SOLN: 15.0000 mL | Freq: Once | OROMUCOSAL | Status: DC

## 2020-10-01 MED ORDER — SODIUM CHLORIDE 0.9 % IV SOLN: INTRAVENOUS | Status: DC

## 2020-10-01 MED ORDER — FAMOTIDINE 20 MG PO TABS: 20.0000 mg | ORAL_TABLET | Freq: Once | ORAL | Status: AC

## 2020-10-01 MED ORDER — CEFAZOLIN SODIUM-DEXTROSE 2-4 GM/100ML-% IV SOLN
2.0000 g | INTRAVENOUS | Status: AC
  Administered 2020-10-02: 2 g via INTRAVENOUS

## 2020-10-01 MED ORDER — ORAL CARE MOUTH RINSE: 15.0000 mL | Freq: Once | OROMUCOSAL | Status: DC

## 2020-10-02 ENCOUNTER — Encounter: Payer: Self-pay | Admitting: Surgery

## 2020-10-02 ENCOUNTER — Ambulatory Visit
Admission: RE | Admit: 2020-10-02 | Discharge: 2020-10-02 | Disposition: A | Discharge status: Home / Self Care | Payer: Medicare Other | Attending: Surgery | Admitting: Surgery

## 2020-10-02 ENCOUNTER — Ambulatory Visit: Payer: Medicare Other

## 2020-10-02 ENCOUNTER — Ambulatory Visit: Payer: Medicare Other | Admitting: Anesthesiology

## 2020-10-02 ENCOUNTER — Other Ambulatory Visit: Payer: Self-pay

## 2020-10-02 ENCOUNTER — Encounter
Admission: RE | Disposition: A | Discharge status: Home / Self Care | Payer: Self-pay | Source: Home / Self Care | Attending: Surgery

## 2020-10-02 DIAGNOSIS — M75121 Complete rotator cuff tear or rupture of right shoulder, not specified as traumatic: Secondary | ICD-10-CM | POA: Diagnosis not present

## 2020-10-02 DIAGNOSIS — I10 Essential (primary) hypertension: Secondary | ICD-10-CM | POA: Diagnosis not present

## 2020-10-02 DIAGNOSIS — M25511 Pain in right shoulder: Secondary | ICD-10-CM

## 2020-10-02 DIAGNOSIS — Z7982 Long term (current) use of aspirin: Secondary | ICD-10-CM | POA: Diagnosis not present

## 2020-10-02 DIAGNOSIS — Z79899 Other long term (current) drug therapy: Secondary | ICD-10-CM | POA: Diagnosis not present

## 2020-10-02 DIAGNOSIS — E119 Type 2 diabetes mellitus without complications: Secondary | ICD-10-CM | POA: Diagnosis not present

## 2020-10-02 HISTORY — PX: SHOULDER ARTHROSCOPY WITH OPEN ROTATOR CUFF REPAIR: SHX6092

## 2020-10-02 LAB — GLUCOSE, CAPILLARY
Glucose-Capillary: 142 mg/dL — ABNORMAL HIGH (ref 70–99)
Glucose-Capillary: 149 mg/dL — ABNORMAL HIGH (ref 70–99)

## 2020-10-02 SURGERY — ARTHROSCOPY, SHOULDER WITH REPAIR, ROTATOR CUFF, OPEN
Anesthesia: General | Site: Shoulder | Laterality: Right

## 2020-10-02 MED ORDER — CHLORHEXIDINE GLUCONATE 0.12 % MT SOLN
OROMUCOSAL | Status: AC
  Filled 2020-10-02: qty 15

## 2020-10-02 MED ORDER — FENTANYL CITRATE (PF) 250 MCG/5ML IJ SOLN
INTRAMUSCULAR | Status: AC
  Filled 2020-10-02: qty 5

## 2020-10-02 MED ORDER — FENTANYL CITRATE (PF) 100 MCG/2ML IJ SOLN: 25.0000 ug | INTRAMUSCULAR | Status: DC | PRN

## 2020-10-02 MED ORDER — LIDOCAINE HCL (PF) 2 % IJ SOLN
INTRAMUSCULAR | Status: AC
  Filled 2020-10-02: qty 5

## 2020-10-02 MED ORDER — ACETAMINOPHEN 10 MG/ML IV SOLN: 1000.0000 mg | Freq: Once | INTRAVENOUS | Status: DC | PRN

## 2020-10-02 MED ORDER — BUPIVACAINE-EPINEPHRINE 0.5% -1:200000 IJ SOLN
INTRAMUSCULAR | Status: DC | PRN
  Administered 2020-10-02: 30 mL

## 2020-10-02 MED ORDER — FENTANYL CITRATE (PF) 100 MCG/2ML IJ SOLN: 50.0000 ug | Freq: Once | INTRAMUSCULAR | Status: AC

## 2020-10-02 MED ORDER — ONDANSETRON HCL 4 MG/2ML IJ SOLN
INTRAMUSCULAR | Status: AC
  Filled 2020-10-02: qty 2

## 2020-10-02 MED ORDER — MIDAZOLAM HCL 2 MG/2ML IJ SOLN
INTRAMUSCULAR | Status: AC
  Administered 2020-10-02: 1 mg via INTRAVENOUS
  Filled 2020-10-02: qty 2

## 2020-10-02 MED ORDER — BUPIVACAINE HCL (PF) 0.5 % IJ SOLN
INTRAMUSCULAR | Status: AC
  Filled 2020-10-02: qty 10

## 2020-10-02 MED ORDER — LIDOCAINE HCL (CARDIAC) PF 100 MG/5ML IV SOSY
PREFILLED_SYRINGE | INTRAVENOUS | Status: DC | PRN
  Administered 2020-10-02: 100 mg via INTRAVENOUS

## 2020-10-02 MED ORDER — FENTANYL CITRATE (PF) 100 MCG/2ML IJ SOLN
INTRAMUSCULAR | Status: AC
  Administered 2020-10-02: 50 ug via INTRAVENOUS
  Filled 2020-10-02: qty 2

## 2020-10-02 MED ORDER — PHENYLEPHRINE HCL (PRESSORS) 10 MG/ML IV SOLN
INTRAVENOUS | Status: DC | PRN
  Administered 2020-10-02: 250 ug via INTRAVENOUS
  Administered 2020-10-02: 200 ug via INTRAVENOUS

## 2020-10-02 MED ORDER — ROCURONIUM BROMIDE 100 MG/10ML IV SOLN
INTRAVENOUS | Status: DC | PRN
  Administered 2020-10-02: 80 mg via INTRAVENOUS

## 2020-10-02 MED ORDER — BUPIVACAINE HCL (PF) 0.5 % IJ SOLN
INTRAMUSCULAR | Status: DC | PRN
  Administered 2020-10-02: 10 mL

## 2020-10-02 MED ORDER — BUPIVACAINE LIPOSOME 1.3 % IJ SUSP
INTRAMUSCULAR | Status: AC
  Filled 2020-10-02: qty 20

## 2020-10-02 MED ORDER — PROPOFOL 10 MG/ML IV BOLUS
INTRAVENOUS | Status: AC
  Filled 2020-10-02: qty 20

## 2020-10-02 MED ORDER — DEXAMETHASONE SODIUM PHOSPHATE 10 MG/ML IJ SOLN
INTRAMUSCULAR | Status: DC | PRN
  Administered 2020-10-02: 10 mg via INTRAVENOUS

## 2020-10-02 MED ORDER — OXYCODONE HCL 5 MG PO TABS: 5.0000 mg | ORAL_TABLET | Freq: Once | ORAL | Status: DC | PRN

## 2020-10-02 MED ORDER — ONDANSETRON HCL 4 MG/2ML IJ SOLN: 4.0000 mg | Freq: Once | INTRAMUSCULAR | Status: DC | PRN

## 2020-10-02 MED ORDER — ONDANSETRON HCL 4 MG/2ML IJ SOLN
INTRAMUSCULAR | Status: DC | PRN
  Administered 2020-10-02: 4 mg via INTRAVENOUS

## 2020-10-02 MED ORDER — EPHEDRINE 5 MG/ML INJ
INTRAVENOUS | Status: AC
  Filled 2020-10-02: qty 10

## 2020-10-02 MED ORDER — PROPOFOL 10 MG/ML IV BOLUS
INTRAVENOUS | Status: DC | PRN
  Administered 2020-10-02: 200 mg via INTRAVENOUS

## 2020-10-02 MED ORDER — PHENYLEPHRINE HCL-NACL 20-0.9 MG/250ML-% IV SOLN
INTRAVENOUS | Status: DC | PRN
  Administered 2020-10-02: 50 ug/min via INTRAVENOUS

## 2020-10-02 MED ORDER — CEFAZOLIN SODIUM-DEXTROSE 2-4 GM/100ML-% IV SOLN
INTRAVENOUS | Status: AC
  Filled 2020-10-02: qty 100

## 2020-10-02 MED ORDER — MIDAZOLAM HCL 2 MG/2ML IJ SOLN: 1.0000 mg | Freq: Once | INTRAMUSCULAR | Status: AC

## 2020-10-02 MED ORDER — EPINEPHRINE PF 1 MG/ML IJ SOLN
INTRAMUSCULAR | Status: AC
  Filled 2020-10-02: qty 1

## 2020-10-02 MED ORDER — OXYCODONE HCL 5 MG PO TABS
5.0000 mg | ORAL_TABLET | ORAL | 0 refills | Status: DC | PRN
Start: 2020-10-02 — End: 2024-05-23

## 2020-10-02 MED ORDER — BUPIVACAINE LIPOSOME 1.3 % IJ SUSP
INTRAMUSCULAR | Status: DC | PRN
  Administered 2020-10-02: 10 mL

## 2020-10-02 MED ORDER — DEXAMETHASONE SODIUM PHOSPHATE 10 MG/ML IJ SOLN
INTRAMUSCULAR | Status: AC
  Filled 2020-10-02: qty 1

## 2020-10-02 MED ORDER — OXYCODONE HCL 5 MG/5ML PO SOLN: 5.0000 mg | Freq: Once | ORAL | Status: DC | PRN

## 2020-10-02 MED ORDER — FENTANYL CITRATE (PF) 100 MCG/2ML IJ SOLN
INTRAMUSCULAR | Status: DC | PRN
  Administered 2020-10-02 (×2): 50 ug via INTRAVENOUS
  Administered 2020-10-02: 100 ug via INTRAVENOUS

## 2020-10-02 MED ORDER — EPHEDRINE SULFATE 50 MG/ML IJ SOLN
INTRAMUSCULAR | Status: DC | PRN
  Administered 2020-10-02: 10 mg via INTRAVENOUS

## 2020-10-02 MED ORDER — FAMOTIDINE 20 MG PO TABS
ORAL_TABLET | ORAL | Status: AC
  Administered 2020-10-02: 20 mg via ORAL
  Filled 2020-10-02: qty 1

## 2020-10-02 MED ORDER — ROCURONIUM BROMIDE 10 MG/ML (PF) SYRINGE
PREFILLED_SYRINGE | INTRAVENOUS | Status: AC
  Filled 2020-10-02: qty 10

## 2020-10-02 MED ORDER — PHENYLEPHRINE HCL (PRESSORS) 10 MG/ML IV SOLN
INTRAVENOUS | Status: AC
  Filled 2020-10-02: qty 1

## 2020-10-02 SURGICAL SUPPLY — 63 items
ANCH SUT 2 2.9 2 LD TPR NDL (Anchor) ×1 IMPLANT
ANCH SUT 5.5 KNTLS (Anchor) ×2 IMPLANT
ANCH SUT BN ASCP DLV (Anchor) ×1 IMPLANT
ANCH SUT Q-FX 2.8 (Anchor) ×3 IMPLANT
ANCH SUT RGNRT REGENETEN (Staple) ×1 IMPLANT
ANCHOR ALL-SUT Q-FIX 2.8 (Anchor) ×9 IMPLANT
ANCHOR BONE REGENETEN (Anchor) ×3 IMPLANT
ANCHOR HEALICOIL REGEN 5.5 (Anchor) ×6 IMPLANT
ANCHOR JUGGERKNOT WTAP NDL 2.9 (Anchor) ×3 IMPLANT
ANCHOR TENDON REGENETEN (Staple) ×3 IMPLANT
APL PRP STRL LF DISP 70% ISPRP (MISCELLANEOUS) ×1
BIT DRILL JUGRKNT W/NDL BIT2.9 (DRILL) ×1 IMPLANT
BLADE FULL RADIUS 3.5 (BLADE) ×3 IMPLANT
BUR ACROMIONIZER 4.0 (BURR) ×3 IMPLANT
CANNULA SHAVER 8MMX76MM (CANNULA) ×3 IMPLANT
CHLORAPREP W/TINT 26 (MISCELLANEOUS) ×3 IMPLANT
COOLER POLAR GLACIER W/PUMP (MISCELLANEOUS) ×3 IMPLANT
COVER MAYO STAND REUSABLE (DRAPES) ×3 IMPLANT
COVER WAND RF STERILE (DRAPES) ×3 IMPLANT
DILATOR 5.5 THREADED HEALICOIL (MISCELLANEOUS) ×3 IMPLANT
DRAPE IMP U-DRAPE 54X76 (DRAPES) ×6 IMPLANT
DRILL JUGGERKNOT W/NDL BIT 2.9 (DRILL) ×3
ELECT CAUTERY BLADE TIP 2.5 (TIP) ×3
ELECT REM PT RETURN 9FT ADLT (ELECTROSURGICAL) ×3
ELECTRODE CAUTERY BLDE TIP 2.5 (TIP) ×1 IMPLANT
ELECTRODE REM PT RTRN 9FT ADLT (ELECTROSURGICAL) ×1 IMPLANT
GAUZE SPONGE 4X4 12PLY STRL (GAUZE/BANDAGES/DRESSINGS) ×3 IMPLANT
GAUZE XEROFORM 1X8 LF (GAUZE/BANDAGES/DRESSINGS) ×3 IMPLANT
GLOVE BIO SURGEON STRL SZ7.5 (GLOVE) ×6 IMPLANT
GLOVE BIO SURGEON STRL SZ8 (GLOVE) ×6 IMPLANT
GLOVE BIOGEL PI IND STRL 8 (GLOVE) ×1 IMPLANT
GLOVE BIOGEL PI INDICATOR 8 (GLOVE) ×2
GLOVE INDICATOR 8.0 STRL GRN (GLOVE) ×3 IMPLANT
GOWN STRL REUS W/ TWL LRG LVL3 (GOWN DISPOSABLE) ×1 IMPLANT
GOWN STRL REUS W/ TWL XL LVL3 (GOWN DISPOSABLE) ×1 IMPLANT
GOWN STRL REUS W/TWL LRG LVL3 (GOWN DISPOSABLE) ×3
GOWN STRL REUS W/TWL XL LVL3 (GOWN DISPOSABLE) ×3
GRASPER SUT 15 45D LOW PRO (SUTURE) IMPLANT
IMPL REGENETEN MEDIUM (Shoulder) ×1 IMPLANT
IMPLANT REGENETEN MEDIUM (Shoulder) ×3 IMPLANT
IV LACTATED RINGER IRRG 3000ML (IV SOLUTION) ×6
IV LR IRRIG 3000ML ARTHROMATIC (IV SOLUTION) ×2 IMPLANT
KIT CANNULA 8X76-LX IN CANNULA (CANNULA) IMPLANT
KIT SUTURE 2.8 Q-FIX DISP (MISCELLANEOUS) ×3 IMPLANT
MANIFOLD NEPTUNE II (INSTRUMENTS) ×3 IMPLANT
MASK FACE SPIDER DISP (MASK) ×3 IMPLANT
MAT ABSORB  FLUID 56X50 GRAY (MISCELLANEOUS) ×2
MAT ABSORB FLUID 56X50 GRAY (MISCELLANEOUS) ×1 IMPLANT
PACK ARTHROSCOPY SHOULDER (MISCELLANEOUS) ×3 IMPLANT
PAD WRAPON POLAR SHDR XLG (MISCELLANEOUS) ×1 IMPLANT
PASSER SUT FIRSTPASS SELF (INSTRUMENTS) ×3 IMPLANT
SLING ARM LRG DEEP (SOFTGOODS) ×3 IMPLANT
SLING ULTRA II LG (MISCELLANEOUS) ×3 IMPLANT
STAPLER SKIN PROX 35W (STAPLE) ×3 IMPLANT
STRAP SAFETY 5IN WIDE (MISCELLANEOUS) ×3 IMPLANT
SUT ETHIBOND 0 MO6 C/R (SUTURE) ×3 IMPLANT
SUT ULTRABRAID 2 COBRAID 38 (SUTURE) ×6 IMPLANT
SUT VIC AB 2-0 CT1 27 (SUTURE) ×6
SUT VIC AB 2-0 CT1 TAPERPNT 27 (SUTURE) ×2 IMPLANT
TAPE MICROFOAM 4IN (TAPE) ×3 IMPLANT
TUBING ARTHRO INFLOW-ONLY STRL (TUBING) ×3 IMPLANT
WAND WEREWOLF FLOW 90D (MISCELLANEOUS) ×3 IMPLANT
WRAPON POLAR PAD SHDR XLG (MISCELLANEOUS) ×3

## 2020-10-02 NOTE — Op Note (Signed)
10/02/2020  1:22 PM  Patient:   Lawrence Patterson  Pre-Op Diagnosis:   Traumatic massive recurrent rotator cuff tear, right shoulder.  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  Procedure:   Extensive arthroscopic debridement, arthroscopic subacromial decompression, mini-open rotator cuff repair supplemented with a Smith & Nephew Regeneten patch, and mini-open biceps tenodesis, right shoulder.  Anesthesia:   General endotracheal with interscalene block using Exparel placed preoperatively by the anesthesiologist.  Surgeon:   Pascal Lux, MD  Assistant:   Cameron Proud, PA-C; Sherlene Shams, PA-S  Findings:   As above.  There was a massive recurrent T-shaped rotator cuff tear involving the entire supraspinatus and infraspinatus tendon insertions with extension into the teres minor insertion.  The subscapularis tendon demonstrated small partial-thickness longitudinal tearing of the superior insertional fibers without compromise of the footprint.  The biceps tendon demonstrated moderate tendinopathic changes without partial or full-thickness tearing.  There were grade 2 chondromalacial changes involving the humerus and glenoid.  The labrum demonstrated fraying anteriorly, superiorly, and postero-inferiorly without frank detachment from the glenoid.  Complications:   None  Fluids:   1000 cc  Estimated blood loss:   50 cc  Tourniquet time:   None  Drains:   None  Closure:   Staples      Brief clinical note:   The patient is a 69 year old male with a several month history of recurrent right shoulder pain and weakness following an injury while doing yard work. He is now 12 years status post a rotator cuff repair of his right shoulder. The patient's symptoms have progressed despite medications, activity modification, etc. The patient's history and examination are  consistent with impingement/tendinopathy with a large recurrent rotator cuff tear. These findings were confirmed by MRI scan. The patient presents at this time for definitive management of his shoulder symptoms.  Procedure:   The patient underwent placement of an interscalene block using Exparel by the anesthesiologist in the preoperative holding area before being brought into the operating room and lain in the supine position. The patient then underwent general endotracheal intubation and anesthesia before being repositioned in the beach chair position using the beach chair positioner. The right shoulder and upper extremity were prepped with ChloraPrep solution before being draped sterilely. Preoperative antibiotics were administered. A timeout was performed to confirm the proper surgical site before the expected portal sites and incision site were injected with 0.5% Sensorcaine with epinephrine.   A posterior portal was created and the glenohumeral joint thoroughly inspected with the findings as described above. An anterior portal was created using an outside-in technique. The labrum and rotator cuff were further probed, again confirming the above-noted findings. The areas of degenerative labral fraying were debrided back to stable margins as were the torn margins of the rotator cuff. Areas of synovitis also were debrided back to stable margins using the full-radius resector. The ArthroCare wand was inserted and used to release the biceps tendon from its labral anchor.  It also was used to obtain hemostasis as well as to "anneal" the labrum superiorly and anteriorly. The instruments were removed from the joint after suctioning the excess fluid.  The camera was repositioned through the posterior portal into the subacromial space. A separate lateral portal was created using an outside-in technique. The 3.5 mm full-radius resector was introduced and used to perform a subtotal bursectomy. The ArthroCare wand was  then inserted and  used to remove the periosteal tissue off the undersurface of the anterior third of the acromion as well as to recess the coracoacromial ligament from its attachment along the anterior and lateral margins of the acromion. The 4.0 mm acromionizing bur was introduced and used to complete the decompression by removing the undersurface of the anterior third of the acromion. The full radius resector was reintroduced to remove any residual bony debris before the ArthroCare wand was reintroduced to obtain hemostasis. The instruments were then removed from the subacromial space after suctioning the excess fluid.  An approximately 4-5 cm incision was made over the anterolateral aspect of the shoulder beginning at the anterolateral corner of the acromion and extending distally in line with the bicipital groove. This incision was carried down through the subcutaneous tissues to expose the deltoid fascia. The raphae between the anterior and middle thirds was identified and this plane developed to provide access into the subacromial space. Additional bursal tissues were debrided sharply using Metzenbaum scissors. The rotator cuff tear was readily identified. The margins were debrided sharply with a #15 blade and the exposed greater tuberosity roughened with a rongeur. The tear was repaired using three Smith & Nephew 2.8 mm Q-Fix anchors. These sutures were then brought back laterally and secured using two Nucla knotless RegeneSorb anchors to create a two-layer closure. Given the size of the tear, the questionable quality of the tissue, and the fact that this was a recurrent tear, it was felt best to reinforce this repair with a [place] patch. Therefore, a medium-sized patch was applied over the repair and secured using the appropriate bone and soft tissue staples. An apparent watertight closure was obtained.  The bicipital groove was identified by palpation and opened for  5-6 cm. The biceps tendon stump was retrieved through this defect. The floor of the bicipital groove was roughened with a curet before a single Biomet 2.9 mm JuggerKnot anchor was inserted. Both sets of sutures were passed through the biceps tendon and tied securely to effect the tenodesis. The bicipital sheath was reapproximated using two #0 Ethibond interrupted sutures, incorporating the biceps tendon to further reinforce the tenodesis.  The wound was copiously irrigated with sterile saline solution before the deltoid raphae was reapproximated using 2-0 Vicryl interrupted sutures. The subcutaneous tissues were closed in two layers using 2-0 Vicryl interrupted sutures before the skin was closed using staples. The portal sites also were closed using staples. A sterile bulky dressing was applied to the shoulder before the arm was placed into a shoulder immobilizer. The patient was then awakened, extubated, and returned to the recovery room in satisfactory condition after tolerating the procedure well.

## 2020-10-02 NOTE — Anesthesia Preprocedure Evaluation (Signed)
Anesthesia Evaluation  Patient identified by MRN, date of birth, ID band Patient awake    Reviewed: Allergy & Precautions, H&P , NPO status , Patient's Chart, lab work & pertinent test results, reviewed documented beta blocker date and time   History of Anesthesia Complications (+) history of anesthetic complications  Airway Mallampati: II  TM Distance: >3 FB Neck ROM: full    Dental  (+) Dental Advidsory Given, Caps, Missing   Pulmonary neg pulmonary ROS, Patient abstained from smoking.Not current smoker,    breath sounds clear to auscultation       Cardiovascular Exercise Tolerance: Good hypertension, (-) angina+ Peripheral Vascular Disease  (-) Past MI and (-) Cardiac Stents (-) dysrhythmias (-) Valvular Problems/Murmurs Rhythm:Regular Rate:Normal - Systolic murmurs    Neuro/Psych negative neurological ROS  negative psych ROS   GI/Hepatic negative GI ROS, Neg liver ROS,   Endo/Other  diabetes  Renal/GU CRFRenal disease  negative genitourinary   Musculoskeletal   Abdominal   Peds  Hematology negative hematology ROS (+)   Anesthesia Other Findings Past Medical History: No date: Arthritis No date: Chronic kidney disease     Comment:  increase protein/ diabetes No date: Complication of anesthesia     Comment:  uvular trauma.  Required a uvulectomy No date: Diabetes mellitus ([place]) No date: Hypertension No date: Proteinuria No date: PVD (peripheral vascular disease) ([facility])     Comment:  vericosity(had surgery)   Reproductive/Obstetrics negative OB ROS                             Anesthesia Physical  Anesthesia Plan  ASA: II  Anesthesia Plan: General   Post-op Pain Management:  Regional for Post-op pain   Induction: Intravenous  PONV Risk Score and Plan: 2 and Midazolam, Dexamethasone and Ondansetron  Airway Management Planned: Oral ETT and Video Laryngoscope  Planned  Additional Equipment: None  Intra-op Plan:   Post-operative Plan: Extubation in OR  Informed Consent: I have reviewed the patients History and Physical, chart, labs and discussed the procedure including the risks, benefits and alternatives for the proposed anesthesia with the patient or authorized representative who has indicated his/her understanding and acceptance.     Dental Advisory Given  Plan Discussed with: CRNA and Surgeon  Anesthesia Plan Comments: (Discussed risks of anesthesia with patient, including PONV, sore throat, lip/dental damage. Rare risks discussed as well, such as cardiorespiratory and neurological sequelae. Patient understands.)        Anesthesia Quick Evaluation

## 2020-10-02 NOTE — Discharge Instructions (Addendum)
Orthopedic discharge instructions: Keep dressing dry and intact.  May sponge-bathe after dressing changed on post-op day #4 (Monday).  Cover staples with Band-Aids after drying off. Apply ice frequently to shoulder or use Polar Care device. Take ibuprofen 600 mg TID with meals for 7-10 days if necessary. Take oxycodone as prescribed when needed.  May supplement with ES Tylenol as necessary. Keep shoulder immobilizer on at all times except may remove for bathing purposes. Follow-up in 10-14 days or as scheduled.  AMBULATORY SURGERY  DISCHARGE INSTRUCTIONS   1) The drugs that you were given will stay in your system until tomorrow so for the next 24 hours you should not:  A) Drive an automobile B) Make any legal decisions C) Drink any alcoholic beverage   2) You may resume regular meals tomorrow.  Today it is better to start with liquids and gradually work up to solid foods.  You may eat anything you prefer, but it is better to start with liquids, then soup and crackers, and gradually work up to solid foods.   3) Please notify your doctor immediately if you have any unusual bleeding, trouble breathing, redness and pain at the surgery site, drainage, fever, or pain not relieved by medication.    4) Additional Instructions:        Please contact your physician with any problems or Same Day Surgery at [phone], Monday through Friday 6 am to 4 pm, or [place] [facility] number at [phone].   Interscalene Nerve Block with Exparel  1.  For your surgery you have received an Interscalene Nerve Block with Exparel. 2. Nerve Blocks affect many types of nerves, including nerves that control movement, pain and normal sensation.  You may experience feelings such as numbness, tingling, heaviness, weakness or the inability to move your arm or the feeling or sensation that your arm has "fallen asleep". 3. A nerve block with Exparel can last up to 5 days.  Usually the weakness  wears off first.  The tingling and heaviness usually wear off next.  Finally you may start to notice pain.  Keep in mind that this may occur in any order.  Once a nerve block starts to wear off it is usually completely gone within 60 minutes. 4. ISNB may cause mild shortness of breath, a hoarse voice, blurry vision, unequal pupils, or drooping of the face on the same side as the nerve block.  These symptoms will usually resolve with the numbness.  Very rarely the procedure itself can cause mild seizures. 5. If needed, your surgeon will give you a prescription for pain medication.  It will take about 60 minutes for the oral pain medication to become fully effective.  So, it is recommended that you start taking this medication before the nerve block first begins to wear off, or when you first begin to feel discomfort. 6. Take your pain medication only as prescribed.  Pain medication can cause sedation and decrease your breathing if you take more than you need for the level of pain that you have. 7. Nausea is a common side effect of many pain medications.  You may want to eat something before taking your pain medicine to prevent nausea. 8. After an Interscalene nerve block, you cannot feel pain, pressure or extremes in temperature in the effected arm.  Because your arm is numb it is at an increased risk for injury.  To decrease the possibility of injury, please practice the following:  a. While you are awake  change the position of your arm frequently to prevent too much pressure on any one area for prolonged periods of time. b.  If you have a cast or tight dressing, check the color or your fingers every couple of hours.  Call your surgeon with the appearance of any discoloration (white or blue). c. If you are given a sling to wear before you go home, please wear it  at all times until the block has completely worn off.  Do not get up at night without your sling. d. Please contact [facility] or your  surgeon if you do not begin to regain sensation after 7 days from the surgery.  Anesthesia may be contacted by calling the Same Day Surgery Department, Mon. through Fri., 6 am to 4 pm at [phone].   e. If you experience any other problems or concerns, please contact your surgeon's office. If you experience severe or prolonged shortness of breath go to the nearest emergency department.

## 2020-10-02 NOTE — Addendum Note (Signed)
Addendum  created 10/02/20 1404 by Arita Miss, MD   Order list changed

## 2020-10-02 NOTE — Anesthesia Procedure Notes (Signed)
Procedure Name: Intubation Performed by: Joia Doyle R, CRNA Pre-anesthesia Checklist: Patient identified, Emergency Drugs available, Suction available and Patient being monitored Patient Re-evaluated:Patient Re-evaluated prior to induction Oxygen Delivery Method: Circle system utilized Preoxygenation: Pre-oxygenation with 100% oxygen Induction Type: IV induction Ventilation: Mask ventilation without difficulty and Oral airway inserted - appropriate to patient size Laryngoscope Size: Mac and 3 Grade View: Grade I Tube type: Oral Tube size: 7.0 mm Number of attempts: 1 Airway Equipment and Method: Oral airway Placement Confirmation: ETT inserted through vocal cords under direct vision,  positive ETCO2 and breath sounds checked- equal and bilateral Secured at: 21 cm Tube secured with: Tape Dental Injury: Teeth and Oropharynx as per pre-operative assessment        

## 2020-10-02 NOTE — Transfer of Care (Signed)
Immediate Anesthesia Transfer of Care Note  Patient: Lawrence Patterson  Procedure(s) Performed: RIGHT SHOULDER ARTHROSCOPY WITH DEBRIDEMENT, DECOMPRESSION, REPAIR OF LARGE RECURRENT ROTATOR CUFF TEAR, AND POSSIBLE BICEPS TENODESIS. (Right Shoulder)  Patient Location: PACU  Anesthesia Type:GA combined with regional for post-op pain  Level of Consciousness: drowsy  Airway & Oxygen Therapy: Patient Spontanous Breathing and Patient connected to face mask oxygen  Post-op Assessment: Report given to RN and Post -op Vital signs reviewed and stable  Post vital signs: Reviewed and stable  Last Vitals:  Vitals Value Taken Time  BP 123/67 10/02/20 1322  Temp    Pulse 71 10/02/20 1324  Resp 14 10/02/20 1324  SpO2 95 % 10/02/20 1324  Vitals shown include unvalidated device data.  Last Pain:  Vitals:   10/02/20 0901  TempSrc: Tympanic  PainSc: 0-No pain         Complications: No complications documented.

## 2020-10-02 NOTE — Anesthesia Postprocedure Evaluation (Signed)
Anesthesia Post Note  Patient: Lawrence Patterson  Procedure(s) Performed: RIGHT SHOULDER ARTHROSCOPY WITH DEBRIDEMENT, DECOMPRESSION, REPAIR OF LARGE RECURRENT ROTATOR CUFF TEAR, AND POSSIBLE BICEPS TENODESIS. (Right Shoulder)  Patient location during evaluation: PACU Anesthesia Type: General Level of consciousness: awake and alert Pain management: pain level controlled Vital Signs Assessment: post-procedure vital signs reviewed and stable Respiratory status: spontaneous breathing, nonlabored ventilation, respiratory function stable and patient connected to nasal cannula oxygen Cardiovascular status: blood pressure returned to baseline and stable Postop Assessment: no apparent nausea or vomiting Anesthetic complications: no   No complications documented.   Last Vitals:  Vitals:   10/02/20 1322 10/02/20 1336  BP: 123/67 (!) 138/92  Pulse: 71 78  Resp: 19 (!) 21  Temp: 36.5 C   SpO2: 92% 96%    Last Pain:  Vitals:   10/02/20 1336  TempSrc:   PainSc: 0-No pain                 Arita Miss

## 2020-10-02 NOTE — Anesthesia Procedure Notes (Signed)
Anesthesia Regional Block: Interscalene brachial plexus block   Pre-Anesthetic Checklist: ,, timeout performed, Correct Patient, Correct Site, Correct Laterality, Correct Procedure, Correct Position, site marked, Risks and benefits discussed,  Surgical consent,  Pre-op evaluation,  At surgeon's request and post-op pain management  Laterality: Left  Prep: chloraprep       Needles:  Injection technique: Single-shot  Needle Type: Echogenic Needle     Needle Length: 4cm  Needle Gauge: 25     Additional Needles:   Narrative:  Start time: 10/02/2020 9:52 AM End time: 10/02/2020 9:53 AM Injection made incrementally with aspirations every 5 mL.  Performed by: Personally  Anesthesiologist: Arita Miss, MD  Additional Notes: Patient consented for risk and benefits of nerve block including but not limited to: nerve damage, failed block, bleeding and infection, hemidiaphragmatic paralysis and shortness of breath, Horner's syndrome.  Patient voiced understanding.  Functioning IV was confirmed and monitors were applied.  Sterile prep,hand hygiene and sterile gloves were used.  Minimal sedation used for procedure.   No paresthesia endorsed by patient during the procedure.  Negative aspiration and negative test dose prior to incremental administration of local anesthetic. The patient tolerated the procedure well with no immediate complications. 19ml exparel + 70ml 0.5% bupi

## 2020-10-02 NOTE — H&P (Signed)
History of Present Illness: Lawrence Patterson is a 69 y.o. male who presents today for his surgical history and physical for upcoming right shoulder arthroscopy with debridement, decompression, repair of recurrent large rotator cuff tear and possible biceps tenodesis of the right shoulder. Surgery scheduled with Dr. Roland Rack on 10/02/2020. The patient denies any significant pain at today's appointment. He denies any repeat trauma or injury affecting the right shoulder. He is taking tramadol as needed for discomfort at this time. He denies any personal history of heart attack, stroke, asthma or COPD. No personal history of blood clots. The patient is a type II diabetic controlled with diet and Trulicity.  Past Medical History: . Diet-controlled type 2 diabetes mellitus (CMS-HCC) 01/13/2018  . DM type 2 with diabetic mixed hyperlipidemia (CMS-HCC) 05/23/2018  . Essential hypertension, benign  . Gout  . Psoriasis  . Pure hypercholesterolemia  . Torn rotator cuff   Past Surgical History: . Laser surgery on left leg 2006  . Pilonidal cyst removed 1972 and 1988  . Right shoulder rotator cuff repair 01/30/2008  subacromial decompression, arthroscopic distal clavicle excision  . 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 TONSILLECTOMY 1960  . Vein stripping  Left leg 1997   Past Family History: . Lung disease Mother  . High blood pressure (Hypertension) Mother  . Arthritis Father  . Lung cancer Father   Medications: . acetaminophen (TYLENOL) 500 MG tablet Take 1,000 mg by mouth every 6 (six) hours as needed  . allopurinoL (ZYLOPRIM) 300 MG tablet TAKE 1 TABLET BY MOUTH EVERY DAY 90 tablet 3  . amLODIPine (NORVASC) 10 MG tablet Take 1 tablet (10 mg total) by mouth every morning 90 tablet 3  . amoxicillin (AMOXIL) 500 MG capsule Take 500 mg by mouth 2 (two) times daily 2,ooomg pre dental procedure.  . ASPIRIN (ASPERDRINK ORAL) Take 81 mg by mouth  once daily.  . carvediloL (COREG) 25 MG tablet Take 1 tablet (25 mg total) by mouth 2 (two) times daily with meals 180 tablet 3  . cholecalciferol (VITAMIN D3) 2,000 unit tablet Take 2,000 Units by mouth once daily  . co-enzyme Q-10, ubiquinone, 200 mg capsule Take 400 mg by mouth 2 (two) times daily  . gabapentin (NEURONTIN) 300 MG capsule TAKE 1 CAPSULE BY MOUTH EVERY NIGHT 90 capsule 3  . GLUCOSAMINE/CHONDROITIN SULF A (GLUCOSAMINE-CHONDROITIN ORAL) Take 2 tablets by mouth once daily.  Marland Kitchen latanoprost (XALATAN) 0.005 % ophthalmic solution Place 1 drop into both eyes every morning  . magnesium oxide 500 mg Cap Take 1 capsule by mouth once daily.  . multivitamin tablet Take 1 tablet by mouth once daily.  Marland Kitchen olmesartan (BENICAR) 20 MG tablet Take 1 tablet (20 mg total) by mouth once daily 90 tablet 3  . pravastatin (PRAVACHOL) 40 MG tablet Take 1 tablet (40 mg total) by mouth nightly 90 tablet 3  . sildenafil (REVATIO) 20 mg tablet Take 1 tablet (20 mg total) by mouth once daily as needed 30 tablet 11  . TRULICITY 0.03 KJ/1.7 mL pen injector INJECT 0.75 MG SUBCUTANEOUSLY ONCE A WEEK 2 mL 5  . zinc citrate-phytase (ZYTAZE) 25-500 mg capsule Take 1 capsule by mouth once daily  . doxylamine succinate (UNISOM) 25 mg tablet Take 12.5 mg by mouth nightly as needed   Allergies: No Known Allergies   Review of Systems:  A comprehensive 14 point ROS was performed, reviewed by me today, and the pertinent orthopaedic findings are documented in the  HPI.  Physical Exam: BP 140/88 (BP Location: Left upper arm, Patient Position: Sitting, BP Cuff Size: Adult)  Ht 175.3 cm (5\' 9" )  Wt (!) 101.2 kg (223 lb 3.2 oz)  BMI 32.96 kg/m  General/Constitutional: The patient appears to be well-nourished, well-developed, and in no acute distress. Neuro/Psych: Normal mood and affect, oriented to person, place and time. Eyes: Non-icteric. Pupils are equal, round, and reactive to light, and exhibit synchronous  movement. ENT: Unremarkable. Lymphatic: No palpable adenopathy. Respiratory: Lungs clear to auscultation, Normal chest excursion, No wheezes and Non-labored breathing Cardiovascular: Regular rate and rhythm. No murmurs. and No edema, swelling or tenderness, except as noted in detailed exam. Integumentary: No impressive skin lesions present, except as noted in detailed exam. Musculoskeletal: Unremarkable, except as noted in detailed exam.  Right shoulder exam: Skin inspection of the right shoulder demonstrates no erythema or ecchymosis. No significant swelling is noted to the right shoulder. Well-healed previous arthroscopic incision sites. No open wound is noted. Patient is nontender palpation along the posterior aspect of the shoulder. Moderate subacromial tenderness with palpation to the right shoulder, increased discomfort palpation along the anterior aspect of the shoulder. No tenderness with palpation over the right AC joint. The patient is able to actively forward flex 150 degrees, passively he is able to reach 160 degrees. The patient is able to abduct the right shoulder approximately 160 degrees, full passive range of motion with abduction with moderate discomfort. With the arm at his side he is able to fully internally rotate, increased discomfort with external rotation. Negative Hawkins test, positive impingement test at today's visit. He is intact light touch on the right upper extremity. The patient has full flexion and extension of the right elbow without discomfort, the distal biceps tendon is intact, negative hook test. The patient is able to make a full composite fist with 4+/5 grip strength. Intact right biceps reflex. Cap refills intact to each individual finger. Radial pulses intact to the right hand.  Imaging: MRI OF THE RIGHT SHOULDER WITHOUT CONTRAST  Status post rotator cuff repair. The patient has rotator cuff  tendinopathy with a complete supraspinatus tendon tear. Retraction   is 3-4cm. No atrophy.   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   Plan:  1. Treatment options were discussed today with the patient. 2. The patient is scheduled for a right shoulder arthroscopy with debridement, decompression, repair of recurrent large rotator cuff tear in addition to possible biceps tenodesis. Surgery is scheduled with Dr. Roland Rack on 10/02/2020. 3. The patient was instructed on the risk and benefits of surgery and wishes to proceed at this time. 4. This document will serve as the surgical history and physical for the patient. 5. The patient will follow-up per standard postop protocol.  The procedure was discussed with the patient, as were the potential risks (including bleeding, infection, nerve and/or blood vessel injury, persistent or recurrent pain, failure of the repair, progression of arthritis, need for further surgery, blood clots, strokes, heart attacks and/or arhythmias, pneumonia, etc.) and benefits. The patient states his understanding and wishes to proceed.   H&P reviewed and patient re-examined. No changes.

## 2020-10-03 ENCOUNTER — Encounter: Payer: Self-pay | Admitting: Surgery

## 2020-12-24 ENCOUNTER — Telehealth: Payer: Self-pay

## 2020-12-24 NOTE — Telephone Encounter (Signed)
Patient's progress notes from [facility] have been placed in patients chart review under media.

## 2021-01-26 ENCOUNTER — Encounter: Payer: Self-pay | Admitting: Dermatology

## 2021-01-26 ENCOUNTER — Other Ambulatory Visit: Payer: Self-pay

## 2021-01-26 ENCOUNTER — Ambulatory Visit (INDEPENDENT_AMBULATORY_CARE_PROVIDER_SITE_OTHER): Payer: Medicare Other | Admitting: Dermatology

## 2021-01-26 DIAGNOSIS — L578 Other skin changes due to chronic exposure to nonionizing radiation: Secondary | ICD-10-CM | POA: Diagnosis not present

## 2021-01-26 DIAGNOSIS — L570 Actinic keratosis: Secondary | ICD-10-CM

## 2021-01-26 DIAGNOSIS — Z85828 Personal history of other malignant neoplasm of skin: Secondary | ICD-10-CM | POA: Diagnosis not present

## 2021-01-26 NOTE — Progress Notes (Signed)
   Follow-Up Visit   Subjective  Lawrence Patterson is a 70 y.o. male who presents for the following: Actinic Keratosis (Check for new or persistent skin lesions on the scalp ) and Basal Cell Carcinoma (Check L lower eyelid S/P MOHS ).  The following portions of the chart were reviewed this encounter and updated as appropriate:   Tobacco  Allergies  Meds  Problems  Med Hx  Surg Hx  Fam Hx     Review of Systems:  No other skin or systemic complaints except as noted in HPI or Assessment and Plan.  Objective  Well appearing patient in no apparent distress; mood and affect are within normal limits.  A focused examination was performed including the face and scalp . Relevant physical exam findings are noted in the Assessment and Plan.  Objective  Scalp (12): Erythematous thin papules/macules with gritty scale.   Assessment & Plan  AK (actinic keratosis) (12) Scalp  Destruction of lesion - Scalp Complexity: simple   Destruction method: cryotherapy   Informed consent: discussed and consent obtained   Timeout:  patient name, date of birth, surgical site, and procedure verified Lesion destroyed using liquid nitrogen: Yes   Region frozen until ice ball extended beyond lesion: Yes   Outcome: patient tolerated procedure well with no complications   Post-procedure details: wound care instructions given     Severe, Confluent Chronic Actinic Changes with Pre-Cancerous Actinic Keratoses due to cumulative sun exposure/UV radiation exposure over time - Discussed Prescription "Field Treatment" Field treatment involves treatment of an entire area of skin that has confluent Actinic Changes (Sun/ Ultraviolet light damage) and PreCancerous Actinic Keratoses by method of PhotoDynamic Therapy (PDT) and/or prescription Topical Chemotherapy agents such as 5-fluorouracil, 5-fluorouracil/calcipotriene, and/or imiquimod.  The purpose is to decrease the number of clinically evident and subclinical PreCancerous  lesions to prevent progression to development of skin cancer by chemically destroying early precancer changes that may or may not be visible.  It has been shown to reduce the risk of developing skin cancer in the treated area. As a result of treatment, redness, scaling, crusting, and open sores may occur during treatment course. One or more than one of these methods may be used and may have to be used several times to control, suppress and eliminate the PreCancerous changes. Discussed treatment course, expected reaction, and possible side effects. - In one month plan PDT of the scalp   History of Basal Cell Carcinoma of the Skin - L lower eyelid  - No evidence of recurrence today - Recommend regular full body skin exams - Recommend daily broad spectrum sunscreen SPF 30+ to sun-exposed areas, reapply every 2 hours as needed.  - Call if any new or changing lesions are noted between office visits  Actinic Damage - chronic, secondary to cumulative UV radiation exposure/sun exposure over time - diffuse scaly erythematous macules with underlying dyspigmentation - Recommend daily broad spectrum sunscreen SPF 30+ to sun-exposed areas, reapply every 2 hours as needed.  - Call for new or changing lesions.  Return in about 1 month (around 02/23/2021) for nurse visit - PDT on the scalp; 6 months AK follow up .  Lawrence Patterson, CMA, am acting as scribe for Sarina Ser, MD .  Documentation: I have reviewed the above documentation for accuracy and completeness, and I agree with the above.  Sarina Ser, MD

## 2021-03-03 ENCOUNTER — Ambulatory Visit (INDEPENDENT_AMBULATORY_CARE_PROVIDER_SITE_OTHER): Payer: Medicare Other

## 2021-03-03 ENCOUNTER — Other Ambulatory Visit: Payer: Self-pay

## 2021-03-03 DIAGNOSIS — L570 Actinic keratosis: Secondary | ICD-10-CM

## 2021-03-03 MED ORDER — AMINOLEVULINIC ACID HCL 20 % EX SOLR
1.0000 "application " | Freq: Once | CUTANEOUS | Status: AC
  Administered 2021-03-03: 354 mg via TOPICAL

## 2021-03-03 NOTE — Patient Instructions (Signed)

## 2021-03-03 NOTE — Progress Notes (Signed)
Patient completed PDT therapy today.  1. AK (actinic keratosis) Scalp  Photodynamic therapy - Scalp Procedure discussed: discussed risks, benefits, side effects. and alternatives   Prep: site scrubbed/prepped with acetone   Location:  Scalp Number of lesions:  Multiple Type of treatment:  Blue light Aminolevulinic Acid (see MAR for details): Levulan Number of Levulan sticks used:  1 Incubation time (minutes):  120 Number of minutes under lamp:  16 Number of seconds under lamp:  40 Cooling:  Floor fan Outcome: patient tolerated procedure well with no complications   Post-procedure details: sunscreen applied     

## 2021-07-14 ENCOUNTER — Other Ambulatory Visit: Payer: Self-pay

## 2021-07-14 ENCOUNTER — Ambulatory Visit (INDEPENDENT_AMBULATORY_CARE_PROVIDER_SITE_OTHER): Payer: Medicare Other | Admitting: Dermatology

## 2021-07-14 ENCOUNTER — Encounter: Payer: Self-pay | Admitting: Dermatology

## 2021-07-14 DIAGNOSIS — L570 Actinic keratosis: Secondary | ICD-10-CM

## 2021-07-14 DIAGNOSIS — D492 Neoplasm of unspecified behavior of bone, soft tissue, and skin: Secondary | ICD-10-CM

## 2021-07-14 DIAGNOSIS — L578 Other skin changes due to chronic exposure to nonionizing radiation: Secondary | ICD-10-CM

## 2021-07-14 DIAGNOSIS — C44319 Basal cell carcinoma of skin of other parts of face: Secondary | ICD-10-CM | POA: Diagnosis not present

## 2021-07-14 DIAGNOSIS — L820 Inflamed seborrheic keratosis: Secondary | ICD-10-CM | POA: Diagnosis not present

## 2021-07-14 DIAGNOSIS — C44310 Basal cell carcinoma of skin of unspecified parts of face: Secondary | ICD-10-CM

## 2021-07-14 NOTE — Patient Instructions (Addendum)
If you have any questions or concerns for your doctor, please call our main line at [phone] and press option 4 to reach your doctor's medical assistant. If no one answers, please leave a voicemail as directed and we will return your call as soon as possible. Messages left after 4 pm will be answered the following business day.   You may also send us a message via MyChart. We typically respond to MyChart messages within 1-2 business days.  For prescription refills, please ask your pharmacy to contact our office. Our fax number is [phone].  If you have an urgent issue when the clinic is closed that cannot wait until the next business day, you can page your doctor at the number below.    Please note that while we do our best to be available for urgent issues outside of office hours, we are not available 24/7.   If you have an urgent issue and are unable to reach us, you may choose to seek medical care at your doctor's office, retail clinic, urgent care center, or emergency room.  If you have a medical emergency, please immediately call 911 or go to the emergency department.  Pager Numbers  - Dr. Kowalski: [phone]  - Dr. Moye: [phone]  - Dr. Stewart: [phone]  In the event of inclement weather, please call our main line at [phone] for an update on the status of any delays or closures.  Dermatology Medication Tips: Please keep the boxes that topical medications come in in order to help keep track of the instructions about where and how to use these. Pharmacies typically print the medication instructions only on the boxes and not directly on the medication tubes.   If your medication is too expensive, please contact our office at [phone] option 4 or send us a message through MyChart.   We are unable to tell what your co-pay for medications will be in advance as this is different depending on your insurance coverage. However, we may be able to find a substitute  medication at lower cost or fill out paperwork to get insurance to cover a needed medication.   If a prior authorization is required to get your medication covered by your insurance company, please allow us 1-2 business days to complete this process.  Drug prices often vary depending on where the prescription is filled and some pharmacies may offer cheaper prices.  The website www.goodrx.com contains coupons for medications through different pharmacies. The prices here do not account for what the cost may be with help from insurance (it may be cheaper with your insurance), but the website can give you the price if you did not use any insurance.  - You can print the associated coupon and take it with your prescription to the pharmacy.  - You may also stop by our office during regular business hours and pick up a GoodRx coupon card.  - If you need your prescription sent electronically to a different pharmacy, notify our office through [facility] MyChart or by phone at [phone] option 4.   Wound Care Instructions  Cleanse wound gently with soap and water once a day then pat dry with clean gauze. Apply a thing coat of Petrolatum (petroleum jelly, "Vaseline") over the wound (unless you have an allergy to this). We recommend that you use a new, sterile tube of Vaseline. Do not pick or remove scabs. Do not remove the yellow or white "healing tissue" from the base of the wound.  Cover the   wound with fresh, clean, nonstick gauze and secure with paper tape. You may use Band-Aids in place of gauze and tape if the would is small enough, but would recommend trimming much of the tape off as there is often too much. Sometimes Band-Aids can irritate the skin.  You should call the office for your biopsy report after 1 week if you have not already been contacted.  If you experience any problems, such as abnormal amounts of bleeding, swelling, significant bruising, significant pain, or evidence of infection,  please call the office immediately.  FOR ADULT SURGERY PATIENTS: If you need something for pain relief you may take 1 extra strength Tylenol (acetaminophen) AND 2 Ibuprofen (200mg each) together every 4 hours as needed for pain. (do not take these if you are allergic to them or if you have a reason you should not take them.) Typically, you may only need pain medication for 1 to 3 days.    

## 2021-07-14 NOTE — Progress Notes (Signed)
Follow-Up Visit   Subjective  Lawrence Patterson is a 70 y.o. male who presents for the following: Actinic Keratosis (Scalp, 75mf/u, PDT with ALA 03/03/21) and check spot (L temple, >637mpt thinks may have been txted in past with LN2, gets irritated by comb and hx of bleeding).  The following portions of the chart were reviewed this encounter and updated as appropriate:   Tobacco  Allergies  Meds  Problems  Med Hx  Surg Hx  Fam Hx     Review of Systems:  No other skin or systemic complaints except as noted in HPI or Assessment and Plan.  Objective  Well appearing patient in no apparent distress; mood and affect are within normal limits.  A focused examination was performed including face, scalp. Relevant physical exam findings are noted in the Assessment and Plan.  scalp x 2 (2) Pink scaly macules   L temple 0.7cm pink pap  Left Temple x 4, Total = 4 (4) Erythematous keratotic or waxy stuck-on papule or plaque.    Assessment & Plan   Actinic Damage - chronic, secondary to cumulative UV radiation exposure/sun exposure over time - diffuse scaly erythematous macules with underlying dyspigmentation - Recommend daily broad spectrum sunscreen SPF 30+ to sun-exposed areas, reapply every 2 hours as needed.  - Recommend staying in the shade or wearing long sleeves, sun glasses (UVA+UVB protection) and wide brim hats (4-inch brim around the entire circumference of the hat). - Call for new or changing lesions.  AK (actinic keratosis) (2) scalp x 2  Good results with PDT with ALA on scalp 03/03/2021  Destruction of lesion - scalp x 2 Complexity: simple   Destruction method: cryotherapy   Informed consent: discussed and consent obtained   Timeout:  patient name, date of birth, surgical site, and procedure verified Lesion destroyed using liquid nitrogen: Yes   Region frozen until ice ball extended beyond lesion: Yes   Outcome: patient tolerated procedure well with no  complications   Post-procedure details: wound care instructions given    Neoplasm of skin L temple  Epidermal / dermal shaving  Lesion diameter (cm):  0.7 Informed consent: discussed and consent obtained   Timeout: patient name, date of birth, surgical site, and procedure verified   Procedure prep:  Patient was prepped and draped in usual sterile fashion Prep type:  Isopropyl alcohol Anesthesia: the lesion was anesthetized in a standard fashion   Anesthetic:  1% lidocaine w/ epinephrine 1-100,000 buffered w/ 8.4% NaHCO3 Instrument used: flexible razor blade   Hemostasis achieved with: pressure, aluminum chloride and electrodesiccation   Outcome: patient tolerated procedure well   Post-procedure details: sterile dressing applied and wound care instructions given   Dressing type: bandage and bacitracin    Destruction of lesion Complexity: extensive   Destruction method: electrodesiccation and curettage   Informed consent: discussed and consent obtained   Timeout:  patient name, date of birth, surgical site, and procedure verified Procedure prep:  Patient was prepped and draped in usual sterile fashion Prep type:  Isopropyl alcohol Anesthesia: the lesion was anesthetized in a standard fashion   Anesthetic:  1% lidocaine w/ epinephrine 1-100,000 buffered w/ 8.4% NaHCO3 Curettage performed in three different directions: Yes   Electrodesiccation performed over the curetted area: Yes   Lesion length (cm):  0.7 Lesion width (cm):  0.7 Margin per side (cm):  0.2 Final wound size (cm):  1.1 Hemostasis achieved with:  pressure, aluminum chloride and electrodesiccation Outcome: patient tolerated procedure well  with no complications   Post-procedure details: sterile dressing applied and wound care instructions given   Dressing type: bandage and bacitracin    Specimen 1 - Surgical pathology Differential Diagnosis: D48.5 BCC vs SCC  Check Margins: No 0.7cm pink pap EDC  Inflamed  seborrheic keratosis Left Temple x 4, Total = 4  Destruction of lesion - Left Temple x 4, Total = 4 Complexity: simple   Destruction method: cryotherapy   Informed consent: discussed and consent obtained   Timeout:  patient name, date of birth, surgical site, and procedure verified Lesion destroyed using liquid nitrogen: Yes   Region frozen until ice ball extended beyond lesion: Yes   Outcome: patient tolerated procedure well with no complications   Post-procedure details: wound care instructions given    Return in about 6 months (around 01/14/2022) for TBSE, Hx of BCC, Hx of AKs.  I, Othelia Pulling, RMA, am acting as scribe for Sarina Ser, MD . Documentation: I have reviewed the above documentation for accuracy and completeness, and I agree with the above.  Sarina Ser, MD

## 2021-07-16 ENCOUNTER — Telehealth: Payer: Self-pay

## 2021-07-16 NOTE — Telephone Encounter (Signed)
Left message for patient to call office for results/hd 

## 2021-07-16 NOTE — Telephone Encounter (Signed)
-----   Message from Ralene Bathe, MD sent at 07/16/2021  3:20 PM EDT ----- Diagnosis Skin , left temple BASAL CELL CARCINOMA, NODULAR AND INFILTRATIVE PATTERNS, BASE INVOLVED  Cancer - [place] Already treated Recheck next visit  - keep 01/28/22 8:45 am appt

## 2021-07-23 ENCOUNTER — Telehealth: Payer: Self-pay

## 2021-07-23 NOTE — Telephone Encounter (Signed)
Left message for patient to call office for results/hd 

## 2021-07-23 NOTE — Telephone Encounter (Signed)
Advised patient of results/hd  

## 2021-07-23 NOTE — Telephone Encounter (Signed)
-----   Message from Ralene Bathe, MD sent at 07/16/2021  3:20 PM EDT ----- Diagnosis Skin , left temple BASAL CELL CARCINOMA, NODULAR AND INFILTRATIVE PATTERNS, BASE INVOLVED  Cancer - [place] Already treated Recheck next visit  - keep 01/28/22 8:45 am appt

## 2021-07-23 NOTE — Telephone Encounter (Signed)
-----   Message from Ralene Bathe, MD sent at 07/16/2021  3:20 PM EDT ----- Diagnosis Skin , left temple BASAL CELL CARCINOMA, NODULAR AND INFILTRATIVE PATTERNS, BASE INVOLVED  Cancer - [place] Already treated Recheck next visit  - keep 01/28/22 8:45 am appt

## 2022-01-28 ENCOUNTER — Other Ambulatory Visit: Payer: Self-pay

## 2022-01-28 ENCOUNTER — Ambulatory Visit (INDEPENDENT_AMBULATORY_CARE_PROVIDER_SITE_OTHER): Payer: Medicare Other | Admitting: Dermatology

## 2022-01-28 DIAGNOSIS — Z1283 Encounter for screening for malignant neoplasm of skin: Secondary | ICD-10-CM

## 2022-01-28 DIAGNOSIS — Z85828 Personal history of other malignant neoplasm of skin: Secondary | ICD-10-CM

## 2022-01-28 DIAGNOSIS — D229 Melanocytic nevi, unspecified: Secondary | ICD-10-CM

## 2022-01-28 DIAGNOSIS — L570 Actinic keratosis: Secondary | ICD-10-CM

## 2022-01-28 DIAGNOSIS — L578 Other skin changes due to chronic exposure to nonionizing radiation: Secondary | ICD-10-CM | POA: Diagnosis not present

## 2022-01-28 DIAGNOSIS — D1800 Hemangioma unspecified site: Secondary | ICD-10-CM

## 2022-01-28 DIAGNOSIS — L820 Inflamed seborrheic keratosis: Secondary | ICD-10-CM

## 2022-01-28 DIAGNOSIS — L821 Other seborrheic keratosis: Secondary | ICD-10-CM

## 2022-01-28 DIAGNOSIS — L814 Other melanin hyperpigmentation: Secondary | ICD-10-CM

## 2022-01-28 MED ORDER — FLUOROURACIL 5 % EX CREA: TOPICAL_CREAM | Freq: Two times a day (BID) | CUTANEOUS | 1 refills | Status: DC

## 2022-01-28 NOTE — Progress Notes (Signed)
Follow-Up Visit   Subjective  Lawrence Patterson is a 71 y.o. male who presents for the following: Total body skin exam (Hx of BCC, hx of AK). The patient presents for Total-Body Skin Exam (TBSE) for skin cancer screening and mole check.  The patient has spots, moles and lesions to be evaluated, some may be new or changing and the patient has concerns that these could be cancer.  The following portions of the chart were reviewed this encounter and updated as appropriate:   Tobacco   Allergies   Meds   Problems   Med Hx   Surg Hx   Fam Hx      Review of Systems:  No other skin or systemic complaints except as noted in HPI or Assessment and Plan.  Objective  Well appearing patient in no apparent distress; mood and affect are within normal limits.  A full examination was performed including scalp, head, eyes, ears, nose, lips, neck, chest, axillae, abdomen, back, buttocks, bilateral upper extremities, bilateral lower extremities, hands, feet, fingers, toes, fingernails, and toenails. All findings within normal limits unless otherwise noted below.  scalp, temples Pink scaly macules  R dorsum hand x 1 Stuck on waxy paps with erythema   Assessment & Plan   Lentigines - Scattered tan macules - Due to sun exposure - Benign-appearing, observe - Recommend daily broad spectrum sunscreen SPF 30+ to sun-exposed areas, reapply every 2 hours as needed. - Call for any changes  Seborrheic Keratoses - Stuck-on, waxy, tan-brown papules and/or plaques  - Benign-appearing - Discussed benign etiology and prognosis. - Observe - Call for any changes  Melanocytic Nevi - Tan-brown and/or pink-flesh-colored symmetric macules and papules - Benign appearing on exam today - Observation - Call clinic for new or changing moles - Recommend daily use of broad spectrum spf 30+ sunscreen to sun-exposed areas.   Hemangiomas - Red papules - Discussed benign nature - Observe - Call for any  changes  Actinic Damage - Severe, confluent actinic changes with pre-cancerous actinic keratoses  - Severe, chronic, not at goal, secondary to cumulative UV radiation exposure over time - diffuse scaly erythematous macules and papules with underlying dyspigmentation - Discussed Prescription "Field Treatment" for Severe, Chronic Confluent Actinic Changes with Pre-Cancerous Actinic Keratoses Field treatment involves treatment of an entire area of skin that has confluent Actinic Changes (Sun/ Ultraviolet light damage) and PreCancerous Actinic Keratoses by method of PhotoDynamic Therapy (PDT) and/or prescription Topical Chemotherapy agents such as 5-fluorouracil, 5-fluorouracil/calcipotriene, and/or imiquimod.  The purpose is to decrease the number of clinically evident and subclinical PreCancerous lesions to prevent progression to development of skin cancer by chemically destroying early precancer changes that may or may not be visible.  It has been shown to reduce the risk of developing skin cancer in the treated area. As a result of treatment, redness, scaling, crusting, and open sores may occur during treatment course. One or more than one of these methods may be used and may have to be used several times to control, suppress and eliminate the PreCancerous changes. Discussed treatment course, expected reaction, and possible side effects. - Recommend daily broad spectrum sunscreen SPF 30+ to sun-exposed areas, reapply every 2 hours as needed.  - Staying in the shade or wearing long sleeves, sun glasses (UVA+UVB protection) and wide brim hats (4-inch brim around the entire circumference of the hat) are also recommended. - Call for new or changing lesions.  -Start 5-fluorouracil/calcipotriene cream twice a day for 7 days to affected  areas including scalp, temples. Prescription sent to [facility]. Patient provided with contact information for pharmacy and advised the pharmacy will mail the prescription  to their home. Patient provided with handout reviewing treatment course and side effects and advised to call or message [place] on MyChart with any concerns.  Skin cancer screening performed today.  History of Basal Cell Carcinoma of the Skin - No evidence of recurrence today - Recommend regular full body skin exams - Recommend daily broad spectrum sunscreen SPF 30+ to sun-exposed areas, reapply every 2 hours as needed.  - Call if any new or changing lesions are noted between office visits  - L lower eyelid, L temple  AK (actinic keratosis) scalp, temples  Start 5FU/Calcipotriene cream to scalp, temples bid for 7 days.  fluorouracil (EFUDEX) 5 % cream - scalp, temples Apply topically 2 (two) times daily. Bid to scalp and temples for 7 days  Inflamed seborrheic keratosis R dorsum hand x 1  Destruction of lesion - R dorsum hand x 1 Complexity: simple   Destruction method: cryotherapy   Informed consent: discussed and consent obtained   Timeout:  patient name, date of birth, surgical site, and procedure verified Lesion destroyed using liquid nitrogen: Yes   Region frozen until ice ball extended beyond lesion: Yes   Outcome: patient tolerated procedure well with no complications   Post-procedure details: wound care instructions given    Skin cancer screening   Return in about 6 months (around 07/28/2022) for AK f/u.  I, Othelia Pulling, RMA, am acting as scribe for Sarina Ser, MD . Documentation: I have reviewed the above documentation for accuracy and completeness, and I agree with the above.  Sarina Ser, MD

## 2022-01-28 NOTE — Patient Instructions (Addendum)
-Start 5-fluorouracil/calcipotriene cream twice a day for 7 days to affected areas including scalp, temples. Prescription sent to [facility]  5-Fluorouracil/Calcipotriene Patient Education   Actinic keratoses are the dry, red scaly spots on the skin caused by sun damage. A portion of these spots can turn into skin cancer with time, and treating them can help prevent development of skin cancer.   Treatment of these spots requires removal of the defective skin cells. There are various ways to remove actinic keratoses, including freezing with liquid nitrogen, treatment with creams, or treatment with a blue light procedure in the office.   5-fluorouracil cream is a topical cream used to treat actinic keratoses. It works by interfering with the growth of abnormal fast-growing skin cells, such as actinic keratoses. These cells peel off and are replaced by healthy ones.   5-fluorouracil/calcipotriene is a combination of the 5-fluorouracil cream with a vitamin D analog cream called calcipotriene. The calcipotriene alone does not treat actinic keratoses. However, when it is combined with 5-fluorouracil, it helps the 5-fluorouracil treat the actinic keratoses much faster so that the same results can be achieved with a much shorter treatment time.  INSTRUCTIONS FOR 5-FLUOROURACIL/CALCIPOTRIENE CREAM:   5-fluorouracil/calcipotriene cream typically only needs to be used for 4-7 days. A thin layer should be applied twice a day to the treatment areas recommended by your physician.   If your physician prescribed you separate tubes of 5-fluourouracil and calcipotriene, apply a thin layer of 5-fluorouracil followed by a thin layer of calcipotriene.   Avoid contact with your eyes, nostrils, and mouth. Do not use 5-fluorouracil/calcipotriene cream on infected or open wounds.   You will develop redness, irritation and some crusting at areas where you have pre-cancer damage/actinic keratoses. IF YOU DEVELOP  PAIN, BLEEDING, OR SIGNIFICANT CRUSTING, STOP THE TREATMENT EARLY - you have already gotten a good response and the actinic keratoses should clear up well.  Wash your hands after applying 5-fluorouracil 5% cream on your skin.   A moisturizer or sunscreen with a minimum SPF 30 should be applied each morning.   Once you have finished the treatment, you can apply a thin layer of Vaseline twice a day to irritated areas to soothe and calm the areas more quickly. If you experience significant discomfort, contact your physician.  For some patients it is necessary to repeat the treatment for best results.  SIDE EFFECTS: When using 5-fluorouracil/calcipotriene cream, you may have mild irritation, such as redness, dryness, swelling, or a mild burning sensation. This usually resolves within 2 weeks. The more actinic keratoses you have, the more redness and inflammation you can expect during treatment. Eye irritation has been reported rarely. If this occurs, please let us know.   If you have any trouble using this cream, please call the office. If you have any other questions about this information, please do not hesitate to ask me before you leave the office.     If You Need Anything After Your Visit  If you have any questions or concerns for your doctor, please call our main line at [phone] and press option 4 to reach your doctor's medical assistant. If no one answers, please leave a voicemail as directed and we will return your call as soon as possible. Messages left after 4 pm will be answered the following business day.   You may also send [place] a message via [place]. We typically respond to MyChart messages within 1-2 business days.  For prescription refills, please ask your pharmacy to contact  our office. Our fax number is [phone].  If you have an urgent issue when the clinic is closed that cannot wait until the next business day, you can page your doctor at the number below.    Please  note that while we do our best to be available for urgent issues outside of office hours, we are not available 24/7.   If you have an urgent issue and are unable to reach [place], you may choose to seek medical care at your doctor's office, retail clinic, urgent care center, or emergency room.  If you have a medical emergency, please immediately call 911 or go to the emergency department.  Pager Numbers  - Dr. Nehemiah Massed: [phone]  - Dr. Laurence Ferrari: [phone]  - Dr. Nicole Kindred: [phone]  In the event of inclement weather, please call our main line at [phone] for an update on the status of any delays or closures.  Dermatology Medication Tips: Please keep the boxes that topical medications come in in order to help keep track of the instructions about where and how to use these. Pharmacies typically print the medication instructions only on the boxes and not directly on the medication tubes.   If your medication is too expensive, please contact our office at [phone] option 4 or send [place] a message through [place].   We are unable to tell what your co-pay for medications will be in advance as this is different depending on your insurance coverage. However, we may be able to find a substitute medication at lower cost or fill out paperwork to get insurance to cover a needed medication.   If a prior authorization is required to get your medication covered by your insurance company, please allow [place] 1-2 business days to complete this process.  Drug prices often vary depending on where the prescription is filled and some pharmacies may offer cheaper prices.  The website www.goodrx.com contains coupons for medications through different pharmacies. The prices here do not account for what the cost may be with help from insurance (it may be cheaper with your insurance), but the website can give you the price if you did not use any insurance.  - You can print the associated coupon and take it with  your prescription to the pharmacy.  - You may also stop by our office during regular business hours and pick up a GoodRx coupon card.  - If you need your prescription sent electronically to a different pharmacy, notify our office through [facility] or by phone at [phone] option 4.     Si Usted Necesita Algo Despus de Su Visita  Tambin puede enviarnos un mensaje a travs de Pharmacist, community. Por lo general respondemos a los mensajes de MyChart en el transcurso de 1 a 2 das hbiles.  Para renovar recetas, por favor pida a su farmacia que se ponga en contacto con nuestra oficina. Harland Dingwall de fax es [place] [phone].  Si tiene un asunto urgente cuando la clnica est cerrada y que no puede esperar hasta el siguiente da hbil, puede llamar/localizar a su doctor(a) al nmero que aparece a continuacin.   Por favor, tenga en cuenta que aunque hacemos todo lo posible para estar disponibles para asuntos urgentes fuera del horario de [place], no estamos disponibles las 24 horas del da, los 7 das de la [place].   Si tiene un problema urgente y no puede comunicarse con nosotros, puede optar por buscar atencin mdica  en el consultorio de su doctor(a), en una clnica privada,  en un centro de atencin urgente o en una sala de emergencias.  Si tiene Engineering geologist, por favor llame inmediatamente al 911 o vaya a la sala de emergencias.  Nmeros de bper  - Dr. Nehemiah Massed: [phone]  - Dra. Moye: [phone]  - Dra. Nicole Kindred: [phone]  En caso de inclemencias del [place], por favor llame a Johnsie Kindred principal al [phone] para una actualizacin sobre el [place] de cualquier retraso o cierre.  Consejos para la medicacin en dermatologa: Por favor, guarde las cajas en las que vienen los medicamentos de uso tpico para ayudarle a seguir las instrucciones sobre dnde y cmo usarlos. Las farmacias generalmente imprimen las instrucciones del medicamento slo en las cajas y  no directamente en los tubos del [place].   Si su medicamento es muy caro, por favor, pngase en contacto con Zigmund Daniel llamando al [phone] y presione la opcin 4 o envenos un mensaje a travs de Pharmacist, community.   No podemos decirle cul ser su copago por los medicamentos por adelantado ya que esto es diferente dependiendo de la cobertura de su seguro. Sin embargo, es posible que podamos encontrar un medicamento sustituto a Electrical engineer un formulario para que el seguro cubra el medicamento que se considera necesario.   Si se requiere una autorizacin previa para que su compaa de seguros [place] su medicamento, por favor permtanos de 1 a 2 das hbiles para completar este proceso.  Los precios de los medicamentos varan con frecuencia dependiendo del Environmental consultant de dnde se surte la receta y alguna farmacias pueden ofrecer precios ms baratos.  El sitio web www.goodrx.com tiene cupones para medicamentos de Airline pilot. Los precios aqu no tienen en cuenta lo que podra costar con la ayuda del seguro (puede ser ms barato con su seguro), pero el sitio web puede darle el precio si no utiliz Research scientist (physical sciences).  - Puede imprimir el cupn correspondiente y llevarlo con su receta a la farmacia.  - Tambin puede pasar por nuestra oficina durante el horario de atencin regular y Charity fundraiser una tarjeta de cupones de GoodRx.  - Si necesita que su receta se enve electrnicamente a una farmacia diferente, informe a nuestra oficina a travs de MyChart de [place] o por telfono llamando al [phone] y presione la opcin 4.

## 2022-02-02 ENCOUNTER — Encounter: Payer: Self-pay | Admitting: Ophthalmology

## 2022-02-04 NOTE — Discharge Instructions (Signed)

## 2022-02-06 ENCOUNTER — Encounter: Payer: Self-pay | Admitting: Dermatology

## 2022-02-08 ENCOUNTER — Encounter: Payer: Self-pay | Admitting: Ophthalmology

## 2022-02-08 ENCOUNTER — Ambulatory Visit: Payer: Medicare Other | Admitting: Anesthesiology

## 2022-02-08 ENCOUNTER — Ambulatory Visit
Admission: RE | Admit: 2022-02-08 | Discharge: 2022-02-08 | Disposition: A | Discharge status: Home / Self Care | Payer: Medicare Other | Source: Ambulatory Visit | Attending: Ophthalmology | Admitting: Ophthalmology

## 2022-02-08 ENCOUNTER — Encounter
Admission: RE | Disposition: A | Discharge status: Home / Self Care | Payer: Self-pay | Source: Ambulatory Visit | Attending: Ophthalmology

## 2022-02-08 ENCOUNTER — Other Ambulatory Visit: Payer: Self-pay

## 2022-02-08 DIAGNOSIS — M1990 Unspecified osteoarthritis, unspecified site: Secondary | ICD-10-CM | POA: Insufficient documentation

## 2022-02-08 DIAGNOSIS — M109 Gout, unspecified: Secondary | ICD-10-CM | POA: Insufficient documentation

## 2022-02-08 DIAGNOSIS — E1122 Type 2 diabetes mellitus with diabetic chronic kidney disease: Secondary | ICD-10-CM | POA: Diagnosis not present

## 2022-02-08 DIAGNOSIS — I129 Hypertensive chronic kidney disease with stage 1 through stage 4 chronic kidney disease, or unspecified chronic kidney disease: Secondary | ICD-10-CM | POA: Diagnosis not present

## 2022-02-08 DIAGNOSIS — H2511 Age-related nuclear cataract, right eye: Secondary | ICD-10-CM | POA: Insufficient documentation

## 2022-02-08 DIAGNOSIS — N189 Chronic kidney disease, unspecified: Secondary | ICD-10-CM | POA: Insufficient documentation

## 2022-02-08 DIAGNOSIS — E1136 Type 2 diabetes mellitus with diabetic cataract: Secondary | ICD-10-CM | POA: Diagnosis not present

## 2022-02-08 HISTORY — PX: CATARACT EXTRACTION W/PHACO: SHX586

## 2022-02-08 LAB — GLUCOSE, CAPILLARY
Glucose-Capillary: 136 mg/dL — ABNORMAL HIGH (ref 70–99)
Glucose-Capillary: 140 mg/dL — ABNORMAL HIGH (ref 70–99)

## 2022-02-08 SURGERY — PHACOEMULSIFICATION, CATARACT, WITH IOL INSERTION
Anesthesia: Monitor Anesthesia Care | Site: Eye | Laterality: Right

## 2022-02-08 MED ORDER — TETRACAINE HCL 0.5 % OP SOLN
1.0000 [drp] | OPHTHALMIC | Status: DC | PRN
  Administered 2022-02-08 (×3): 1 [drp] via OPHTHALMIC

## 2022-02-08 MED ORDER — SIGHTPATH DOSE#1 SODIUM HYALURONATE 23 MG/ML IO SOLUTION
PREFILLED_SYRINGE | INTRAOCULAR | Status: DC | PRN
Start: 2022-02-08 — End: 2022-02-08
  Administered 2022-02-08: 0.6 mL via INTRAOCULAR

## 2022-02-08 MED ORDER — ACETAMINOPHEN 500 MG PO TABS: 1000.0000 mg | ORAL_TABLET | Freq: Once | ORAL | Status: DC | PRN

## 2022-02-08 MED ORDER — ACETAMINOPHEN 160 MG/5ML PO SOLN: 975.0000 mg | Freq: Once | ORAL | Status: DC | PRN

## 2022-02-08 MED ORDER — ARMC OPHTHALMIC DILATING DROPS
1.0000 "application " | OPHTHALMIC | Status: AC | PRN
  Administered 2022-02-08 (×3): 1 via OPHTHALMIC

## 2022-02-08 MED ORDER — MOXIFLOXACIN HCL 0.5 % OP SOLN
OPHTHALMIC | Status: DC | PRN
Start: 2022-02-08 — End: 2022-02-08
  Administered 2022-02-08: 0.2 mL via OPHTHALMIC

## 2022-02-08 MED ORDER — FENTANYL CITRATE (PF) 100 MCG/2ML IJ SOLN
INTRAMUSCULAR | Status: DC | PRN
  Administered 2022-02-08: 50 ug via INTRAVENOUS

## 2022-02-08 MED ORDER — EPINEPHRINE PF 1 MG/ML IJ SOLN
INTRAMUSCULAR | Status: DC | PRN
Start: 2022-02-08 — End: 2022-02-08
  Administered 2022-02-08: 73 mL via OPHTHALMIC

## 2022-02-08 MED ORDER — MIDAZOLAM HCL 2 MG/2ML IJ SOLN
INTRAMUSCULAR | Status: DC | PRN
Start: 2022-02-08 — End: 2022-02-08
  Administered 2022-02-08: 2 mg via INTRAVENOUS

## 2022-02-08 MED ORDER — SIGHTPATH DOSE#1 SODIUM HYALURONATE 10 MG/ML IO SOLUTION
PREFILLED_SYRINGE | INTRAOCULAR | Status: DC | PRN
  Administered 2022-02-08: 0.85 mL via INTRAOCULAR

## 2022-02-08 MED ORDER — LACTATED RINGERS IV SOLN: INTRAVENOUS | Status: DC

## 2022-02-08 MED ORDER — SIGHTPATH DOSE#1 BSS IO SOLN
INTRAOCULAR | Status: DC | PRN
Start: 2022-02-08 — End: 2022-02-08
  Administered 2022-02-08: 15 mL

## 2022-02-08 MED ORDER — LIDOCAINE HCL (PF) 2 % IJ SOLN
INTRAOCULAR | Status: DC | PRN
  Administered 2022-02-08: 1 mL via INTRAOCULAR

## 2022-02-08 MED ORDER — ONDANSETRON HCL 4 MG/2ML IJ SOLN: 4.0000 mg | Freq: Once | INTRAMUSCULAR | Status: DC | PRN

## 2022-02-08 SURGICAL SUPPLY — 20 items
CANNULA ANT/CHMB 27G (MISCELLANEOUS) IMPLANT
CANNULA ANT/CHMB 27GA (MISCELLANEOUS) IMPLANT
CATARACT SUITE SIGHTPATH (MISCELLANEOUS) ×2 IMPLANT
DISSECTOR HYDRO NUCLEUS 50X22 (MISCELLANEOUS) ×2 IMPLANT
FEE CATARACT SUITE SIGHTPATH (MISCELLANEOUS) ×1 IMPLANT
GLOVE SURG GAMMEX PI TX LF 7.5 (GLOVE) ×2 IMPLANT
GLOVE SURG SYN 8.5  E (GLOVE) ×1
GLOVE SURG SYN 8.5 E (GLOVE) ×1 IMPLANT
GLOVE SURG SYN 8.5 PF PI (GLOVE) ×1 IMPLANT
LENS IOL TECNIS EYHANCE 19.0 (Intraocular Lens) ×1 IMPLANT
NDL FILTER BLUNT 18X1 1/2 (NEEDLE) ×1 IMPLANT
NEEDLE FILTER BLUNT 18X 1/2SAF (NEEDLE) ×1
NEEDLE FILTER BLUNT 18X1 1/2 (NEEDLE) ×1 IMPLANT
PACK VIT ANT 23G (MISCELLANEOUS) IMPLANT
RING MALYGIN (MISCELLANEOUS) IMPLANT
SUT ETHILON 10-0 CS-B-6CS-B-6 (SUTURE)
SUTURE EHLN 10-0 CS-B-6CS-B-6 (SUTURE) IMPLANT
SYR 3ML LL SCALE MARK (SYRINGE) ×2 IMPLANT
SYR 5ML LL (SYRINGE) ×2 IMPLANT
WATER STERILE IRR 250ML POUR (IV SOLUTION) ×2 IMPLANT

## 2022-02-08 NOTE — Anesthesia Preprocedure Evaluation (Addendum)
Anesthesia Evaluation  Patient identified by MRN, date of birth, ID band Patient awake    Reviewed: Allergy & Precautions, H&P , NPO status , Patient's Chart, lab work & pertinent test results, reviewed documented beta blocker date and time   History of Anesthesia Complications (+) history of anesthetic complications (uvular trauma.  Required a uvulectomy)  Airway Mallampati: II  TM Distance: >3 FB Neck ROM: full    Dental no notable dental hx.    Pulmonary neg pulmonary ROS,    Pulmonary exam normal breath sounds clear to auscultation       Cardiovascular Exercise Tolerance: Good hypertension, negative cardio ROS   Rhythm:regular Rate:Normal     Neuro/Psych negative neurological ROS  negative psych ROS   GI/Hepatic negative GI ROS, Neg liver ROS,   Endo/Other  negative endocrine ROSdiabetes  Renal/GU CRFRenal diseasenegative Renal ROS  negative genitourinary   Musculoskeletal  (+) Arthritis  (gout),   Abdominal   Peds  Hematology negative hematology ROS (+)   Anesthesia Other Findings   Reproductive/Obstetrics negative OB ROS                             Anesthesia Physical Anesthesia Plan  ASA: 3  Anesthesia Plan: MAC   Post-op Pain Management:    Induction:   PONV Risk Score and Plan: 1 and TIVA, Midazolam and Treatment may vary due to age or medical condition  Airway Management Planned:   Additional Equipment:   Intra-op Plan:   Post-operative Plan:   Informed Consent: I have reviewed the patients History and Physical, chart, labs and discussed the procedure including the risks, benefits and alternatives for the proposed anesthesia with the patient or authorized representative who has indicated his/her understanding and acceptance.     Dental Advisory Given  Plan Discussed with: CRNA  Anesthesia Plan Comments:        Anesthesia Quick Evaluation

## 2022-02-08 NOTE — Op Note (Signed)
OPERATIVE NOTE  Lawrence Patterson [id number] 02/08/2022   PREOPERATIVE DIAGNOSIS:  Nuclear sclerotic cataract right eye.  H25.11   POSTOPERATIVE DIAGNOSIS:    Nuclear sclerotic cataract right eye.     PROCEDURE:  Phacoemusification with posterior chamber intraocular lens placement of the right eye   LENS:   Implant Name Type Inv. Item Serial No. Manufacturer Lot No. LRB No. Used Action  LENS IOL TECNIS EYHANCE 19.0 - [id number] Intraocular Lens LENS IOL TECNIS EYHANCE 19.0 [id number] S[employer]  Right 1 Implanted       Procedure(s) with comments: CATARACT EXTRACTION PHACO AND INTRAOCULAR LENS PLACEMENT (IOC) RIGHT DIABETIC (Right) - 8.14 0:48.6  DIB00 +19.0   ULTRASOUND TIME: 0 minutes 48 seconds.  CDE 8.14   SURGEON:  Benay Pillow, MD, MPH  ANESTHESIOLOGIST: Anesthesiologist: April Manson, MD CRNA: Garner Nash, CRNA   ANESTHESIA:  Topical with tetracaine drops augmented with 1% preservative-free intracameral lidocaine.  ESTIMATED BLOOD LOSS: less than 1 mL.   COMPLICATIONS:  None.   DESCRIPTION OF PROCEDURE:  The patient was identified in the holding room and transported to the operating room and placed in the supine position under the operating microscope.  The right eye was identified as the operative eye and it was prepped and draped in the usual sterile ophthalmic fashion.   A 1.0 millimeter clear-corneal paracentesis was made at the 10:30 position. 0.5 ml of preservative-free 1% lidocaine with epinephrine was injected into the anterior chamber.  The anterior chamber was filled with Healon 5 viscoelastic.  A 2.4 millimeter keratome was used to make a near-clear corneal incision at the 8:00 position.  A curvilinear capsulorrhexis was made with a cystotome and capsulorrhexis forceps.  Balanced salt solution was used to hydrodissect and hydrodelineate the nucleus.   Phacoemulsification was then used in stop and chop fashion to remove the lens nucleus and epinucleus.   The remaining cortex was then removed using the irrigation and aspiration handpiece. Healon was then placed into the capsular bag to distend it for lens placement.  A lens was then injected into the capsular bag.  The remaining viscoelastic was aspirated.   Wounds were hydrated with balanced salt solution.  The anterior chamber was inflated to a physiologic pressure with balanced salt solution.   Intracameral vigamox 0.1 mL undiluted was injected into the eye and a drop placed onto the ocular surface.  No wound leaks were noted.  The patient was taken to the recovery room in stable condition without complications of anesthesia or surgery  Benay Pillow 02/08/2022, 8:25 AM

## 2022-02-08 NOTE — H&P (Signed)
[place]   Primary Care Physician:  Rusty Aus, MD Ophthalmologist: Dr. Benay Pillow  Pre-Procedure History & Physical: HPI:  Lawrence Patterson is a 71 y.o. male here for cataract surgery.   Past Medical History:  Diagnosis Date   Actinic keratosis    Arthritis    Basal cell carcinoma 09/08/2020   left lower eyelid    Basal cell carcinoma 07/14/2021   Left temple   Chronic kidney disease    increase protein/ diabetes   Complication of anesthesia    uvular trauma.  Required a uvulectomy   Diabetes mellitus ([place])    Hypertension    Proteinuria    PVD (peripheral vascular disease) ([place])    vericosity(had surgery)    Past Surgical History:  Procedure Laterality Date   KNEE ARTHROPLASTY Right 05/07/2019   Procedure: COMPUTER ASSISTED TOTAL KNEE ARTHROPLASTY RIGHT;  Surgeon: Dereck Leep, MD;  Location: [facility];  Service: Orthopedics;  Laterality: Right;   KNEE ARTHROSCOPY Left    PILONIDAL CYST EXCISION     PILONIDAL CYST EXCISION     x2   ROTATOR CUFF REPAIR Right    SHOULDER ARTHROSCOPY WITH OPEN ROTATOR CUFF REPAIR Right 10/02/2020   Procedure: RIGHT SHOULDER ARTHROSCOPY WITH DEBRIDEMENT, DECOMPRESSION, REPAIR OF LARGE RECURRENT ROTATOR CUFF TEAR, AND POSSIBLE BICEPS TENODESIS.;  Surgeon: Corky Mull, MD;  Location: [facility];  Service: Orthopedics;  Laterality: Right;   TONSILLECTOMY     UVULECTOMY     VARICOSE VEIN SURGERY Left 1992   VASECTOMY  1990   VEIN SURGERY     laser    Prior to Admission medications   Medication Sig Start Date End Date Taking? Authorizing Provider  acetaminophen (TYLENOL) 500 MG tablet Take 1,000 mg by mouth 2 (two) times daily as needed for mild pain or headache.   Yes [provider]  allopurinol (ZYLOPRIM) 300 MG tablet Take 300 mg by mouth daily.   Yes [provider]  amLODipine (NORVASC) 10 MG tablet Take 10 mg by mouth daily.   Yes [provider]  amoxicillin (AMOXIL) 500 MG tablet Take  500 mg by mouth as needed. Take 4 tablets one hour prior to dental procedures   Yes [provider]  aspirin 81 MG EC tablet Take 81 mg by mouth daily.   Yes [provider]  carvedilol (COREG) 25 MG tablet Take 25 mg by mouth 2 (two) times daily with breakfast and lunch. 02/25/19  Yes [provider]  Cholecalciferol (VITAMIN D) 50 MCG (2000 UT) CAPS Take 2,000 Units by mouth daily.   Yes [provider]  Coenzyme Q10 (COQ10) 200 MG CAPS Take 200 mg by mouth 2 (two) times a day.   Yes [provider]  gabapentin (NEURONTIN) 300 MG capsule Take 300 mg by mouth at bedtime. 02/02/19  Yes [provider]  glucosamine-chondroitin 500-400 MG tablet Take 1 tablet by mouth 2 (two) times a day.    Yes [provider]  latanoprost (XALATAN) 0.005 % ophthalmic solution Place 1 drop into both eyes in the morning.    Yes [provider]  magnesium gluconate (MAGONATE) 500 MG tablet Take 500 mg by mouth at bedtime.   Yes [provider]  Multiple Vitamin (MULTIVITAMIN) capsule Take 1 capsule by mouth daily.   Yes [provider]  olmesartan (BENICAR) 20 MG tablet Take 20 mg by mouth daily. 12/16/18  Yes [provider]  pravastatin (PRAVACHOL) 40 MG tablet Take 40  mg by mouth at bedtime. 12/15/18  Yes [provider]  sertraline (ZOLOFT) 100 MG tablet Take 100 mg by mouth at bedtime.   Yes [provider]  TRULICITY 2.50 NL/9.7QB SOPN Inject 0.75 mg into the skin every 7 (seven) days. Saturday 02/27/19  Yes [provider]  Zinc Methionate 50 MG CAPS Take 50 mg by mouth daily.   Yes [provider]  fluorouracil (EFUDEX) 5 % cream Apply topically 2 (two) times daily. Bid to scalp and temples for 7 days 01/28/22   Ralene Bathe, MD  oxyCODONE (OXY IR/ROXICODONE) 5 MG immediate release tablet Take 1-2 tablets (5-10 mg total) by mouth every 4 (four) hours as needed for moderate pain or  severe pain (pain score 4-6). Patient not taking: Reported on 02/02/2022 10/02/20   Poggi, Marshall Cork, MD  sildenafil (REVATIO) 20 MG tablet Take 20 mg by mouth daily as needed (ED).     [provider]    Allergies as of 01/08/2022   (No Known Allergies)    History reviewed. No pertinent family history.  Social History   Socioeconomic History   Marital status: Widowed    Spouse name: Not on file   Number of children: Not on file   Years of education: Not on file   Highest education level: Not on file  Occupational History   Not on file  Tobacco Use   Smoking status: Never   Smokeless tobacco: Current    Types: Chew  Vaping Use   Vaping Use: Never used  Substance and Sexual Activity   Alcohol use: Never   Drug use: Never   Sexual activity: Yes  Other Topics Concern   Not on file  Social History Narrative   Not on file   Social Determinants of Health   Financial Resource Strain: Not on file  Food Insecurity: Not on file  Transportation Needs: Not on file  Physical Activity: Not on file  Stress: Not on file  Social Connections: Not on file  Intimate Partner Violence: Not on file    Review of Systems: See HPI, otherwise negative ROS  Physical Exam: BP (!) 165/83    Pulse 60    Temp (!) 97.2 F (36.2 C) (Temporal)    Ht 5\' 9"  (1.753 m)    Wt 96 kg    SpO2 97%    BMI 31.26 kg/m  General:   Alert, cooperative in NAD Head:  Normocephalic and atraumatic. Respiratory:  Normal work of breathing. Cardiovascular:  RRR  Impression/Plan: Lawrence Patterson is here for cataract surgery.  Risks, benefits, limitations, and alternatives regarding cataract surgery have been reviewed with the patient.  Questions have been answered.  All parties agreeable.   Benay Pillow, MD  02/08/2022, 7:57 AM

## 2022-02-08 NOTE — Transfer of Care (Signed)
Immediate Anesthesia Transfer of Care Note  Patient: Lawrence Patterson  Procedure(s) Performed: CATARACT EXTRACTION PHACO AND INTRAOCULAR LENS PLACEMENT (IOC) RIGHT DIABETIC (Right: Eye)  Patient Location: PACU  Anesthesia Type: MAC  Level of Consciousness: awake, alert  and patient cooperative  Airway and Oxygen Therapy: Patient Spontanous Breathing and Patient connected to supplemental oxygen  Post-op Assessment: Post-op Vital signs reviewed, Patient's Cardiovascular Status Stable, Respiratory Function Stable, Patent Airway and No signs of Nausea or vomiting  Post-op Vital Signs: Reviewed and stable  Complications: No notable events documented.

## 2022-02-08 NOTE — Anesthesia Postprocedure Evaluation (Signed)
Anesthesia Post Note  Patient: Lawrence Patterson  Procedure(s) Performed: CATARACT EXTRACTION PHACO AND INTRAOCULAR LENS PLACEMENT (IOC) RIGHT DIABETIC (Right: Eye)     Patient location during evaluation: PACU Anesthesia Type: MAC Level of consciousness: awake and alert Pain management: pain level controlled Vital Signs Assessment: post-procedure vital signs reviewed and stable Respiratory status: spontaneous breathing, nonlabored ventilation and respiratory function stable Cardiovascular status: blood pressure returned to baseline and stable Postop Assessment: no apparent nausea or vomiting Anesthetic complications: no   No notable events documented.  April Manson

## 2022-02-09 ENCOUNTER — Encounter: Payer: Self-pay | Admitting: Ophthalmology

## 2022-02-17 NOTE — Discharge Instructions (Signed)

## 2022-02-22 ENCOUNTER — Encounter
Admission: RE | Disposition: A | Discharge status: Home / Self Care | Payer: Self-pay | Source: Ambulatory Visit | Attending: Ophthalmology

## 2022-02-22 ENCOUNTER — Ambulatory Visit: Payer: Medicare Other | Admitting: Anesthesiology

## 2022-02-22 ENCOUNTER — Other Ambulatory Visit: Payer: Self-pay

## 2022-02-22 ENCOUNTER — Encounter: Payer: Self-pay | Admitting: Ophthalmology

## 2022-02-22 ENCOUNTER — Ambulatory Visit
Admission: RE | Admit: 2022-02-22 | Discharge: 2022-02-22 | Disposition: A | Discharge status: Home / Self Care | Payer: Medicare Other | Source: Ambulatory Visit | Attending: Ophthalmology | Admitting: Ophthalmology

## 2022-02-22 DIAGNOSIS — H2512 Age-related nuclear cataract, left eye: Secondary | ICD-10-CM | POA: Diagnosis not present

## 2022-02-22 DIAGNOSIS — I129 Hypertensive chronic kidney disease with stage 1 through stage 4 chronic kidney disease, or unspecified chronic kidney disease: Secondary | ICD-10-CM | POA: Diagnosis not present

## 2022-02-22 DIAGNOSIS — N189 Chronic kidney disease, unspecified: Secondary | ICD-10-CM | POA: Insufficient documentation

## 2022-02-22 DIAGNOSIS — E1122 Type 2 diabetes mellitus with diabetic chronic kidney disease: Secondary | ICD-10-CM | POA: Insufficient documentation

## 2022-02-22 LAB — GLUCOSE, CAPILLARY
Glucose-Capillary: 131 mg/dL — ABNORMAL HIGH (ref 70–99)
Glucose-Capillary: 129 mg/dL — ABNORMAL HIGH (ref 70–99)

## 2022-02-22 SURGERY — PHACOEMULSIFICATION, CATARACT, WITH IOL INSERTION
Anesthesia: Monitor Anesthesia Care | Site: Eye | Laterality: Left

## 2022-02-22 MED ORDER — LIDOCAINE HCL (PF) 2 % IJ SOLN
INTRAOCULAR | Status: DC | PRN
  Administered 2022-02-22: 4 mL via INTRAOCULAR

## 2022-02-22 MED ORDER — SIGHTPATH DOSE#1 BSS IO SOLN
INTRAOCULAR | Status: DC | PRN
Start: 2022-02-22 — End: 2022-02-22
  Administered 2022-02-22: 15 mL via INTRAOCULAR

## 2022-02-22 MED ORDER — SIGHTPATH DOSE#1 SODIUM HYALURONATE 23 MG/ML IO SOLUTION
PREFILLED_SYRINGE | INTRAOCULAR | Status: DC | PRN
Start: 2022-02-22 — End: 2022-02-22
  Administered 2022-02-22: 0.6 mL via INTRAOCULAR

## 2022-02-22 MED ORDER — ARMC OPHTHALMIC DILATING DROPS
1.0000 "application " | OPHTHALMIC | Status: DC | PRN
  Administered 2022-02-22 (×3): 1 via OPHTHALMIC

## 2022-02-22 MED ORDER — TETRACAINE HCL 0.5 % OP SOLN
1.0000 [drp] | OPHTHALMIC | Status: DC | PRN
  Administered 2022-02-22 (×3): 1 [drp] via OPHTHALMIC

## 2022-02-22 MED ORDER — MIDAZOLAM HCL 2 MG/2ML IJ SOLN
INTRAMUSCULAR | Status: DC | PRN
  Administered 2022-02-22: 2 mg via INTRAVENOUS

## 2022-02-22 MED ORDER — MOXIFLOXACIN HCL 0.5 % OP SOLN
OPHTHALMIC | Status: DC | PRN
Start: 2022-02-22 — End: 2022-02-22
  Administered 2022-02-22: 0.2 mL via OPHTHALMIC

## 2022-02-22 MED ORDER — LACTATED RINGERS IV SOLN: INTRAVENOUS | Status: DC

## 2022-02-22 MED ORDER — SIGHTPATH DOSE#1 SODIUM HYALURONATE 10 MG/ML IO SOLUTION
PREFILLED_SYRINGE | INTRAOCULAR | Status: DC | PRN
  Administered 2022-02-22: 0.85 mL via INTRAOCULAR

## 2022-02-22 MED ORDER — FENTANYL CITRATE (PF) 100 MCG/2ML IJ SOLN
INTRAMUSCULAR | Status: DC | PRN
Start: 2022-02-22 — End: 2022-02-22
  Administered 2022-02-22: 50 ug via INTRAVENOUS

## 2022-02-22 MED ORDER — SIGHTPATH DOSE#1 BSS IO SOLN
INTRAOCULAR | Status: DC | PRN
  Administered 2022-02-22: 70 mL via OPHTHALMIC

## 2022-02-22 SURGICAL SUPPLY — 14 items
CATARACT SUITE SIGHTPATH (MISCELLANEOUS) ×2 IMPLANT
DISSECTOR HYDRO NUCLEUS 50X22 (MISCELLANEOUS) ×2 IMPLANT
FEE CATARACT SUITE SIGHTPATH (MISCELLANEOUS) ×1 IMPLANT
GLOVE SURG GAMMEX PI TX LF 7.5 (GLOVE) ×2 IMPLANT
GLOVE SURG SYN 8.5  E (GLOVE) ×1
GLOVE SURG SYN 8.5 E (GLOVE) ×1 IMPLANT
GLOVE SURG SYN 8.5 PF PI (GLOVE) ×1 IMPLANT
LENS IOL TECNIS EYHANCE 19.0 (Intraocular Lens) ×1 IMPLANT
NDL FILTER BLUNT 18X1 1/2 (NEEDLE) ×1 IMPLANT
NEEDLE FILTER BLUNT 18X 1/2SAF (NEEDLE) ×1
NEEDLE FILTER BLUNT 18X1 1/2 (NEEDLE) ×1 IMPLANT
SYR 3ML LL SCALE MARK (SYRINGE) ×2 IMPLANT
SYR 5ML LL (SYRINGE) ×2 IMPLANT
WATER STERILE IRR 250ML POUR (IV SOLUTION) ×2 IMPLANT

## 2022-02-22 NOTE — H&P (Signed)
[place]  ? ?Primary Care Physician:  Rusty Aus, MD ?Ophthalmologist: Dr. Benay Pillow ? ?Pre-Procedure History & Physical: ?HPI:  Lawrence Patterson is a 71 y.o. male here for cataract surgery. ?  ?Past Medical History:  ?Diagnosis Date  ? Actinic keratosis   ? Arthritis   ? Basal cell carcinoma 09/08/2020  ? left lower eyelid   ? Basal cell carcinoma 07/14/2021  ? Left temple  ? Chronic kidney disease   ? increase protein/ diabetes  ? Complication of anesthesia   ? uvular trauma.  Required a uvulectomy  ? Diabetes mellitus ([place])   ? Hypertension   ? Proteinuria   ? PVD (peripheral vascular disease) ([place])   ? vericosity(had surgery)  ? ? ?Past Surgical History:  ?Procedure Laterality Date  ? CATARACT EXTRACTION W/PHACO Right 02/08/2022  ? Procedure: CATARACT EXTRACTION PHACO AND INTRAOCULAR LENS PLACEMENT ([place]) RIGHT DIABETIC;  Surgeon: Eulogio Bear, MD;  Location: [place];  Service: Ophthalmology;  Laterality: Right;  8.14 ?0:48.6  ? KNEE ARTHROPLASTY Right 05/07/2019  ? Procedure: COMPUTER ASSISTED TOTAL KNEE ARTHROPLASTY RIGHT;  Surgeon: Dereck Leep, MD;  Location: [facility];  Service: Orthopedics;  Laterality: Right;  ? KNEE ARTHROSCOPY Left   ? PILONIDAL CYST EXCISION    ? PILONIDAL CYST EXCISION    ? x2  ? ROTATOR CUFF REPAIR Right   ? SHOULDER ARTHROSCOPY WITH OPEN ROTATOR CUFF REPAIR Right 10/02/2020  ? Procedure: RIGHT SHOULDER ARTHROSCOPY WITH DEBRIDEMENT, DECOMPRESSION, REPAIR OF LARGE RECURRENT ROTATOR CUFF TEAR, AND POSSIBLE BICEPS TENODESIS.;  Surgeon: Corky Mull, MD;  Location: [facility];  Service: Orthopedics;  Laterality: Right;  ? TONSILLECTOMY    ? UVULECTOMY    ? VARICOSE VEIN SURGERY Left 1992  ? VASECTOMY  1990  ? VEIN SURGERY    ? laser  ? ? ?Prior to Admission medications   ?Medication Sig Start Date End Date Taking? Authorizing Provider  ?acetaminophen (TYLENOL) 500 MG tablet Take 1,000 mg by mouth 2 (two) times daily as needed for mild pain or headache.    Yes [provider]  ?allopurinol (ZYLOPRIM) 300 MG tablet Take 300 mg by mouth daily.   Yes [provider]  ?amLODipine (NORVASC) 10 MG tablet Take 10 mg by mouth daily.   Yes [provider]  ?aspirin 81 MG EC tablet Take 81 mg by mouth daily.   Yes [provider]  ?carvedilol (COREG) 25 MG tablet Take 25 mg by mouth 2 (two) times daily with breakfast and lunch. 02/25/19  Yes [provider]  ?Cholecalciferol (VITAMIN D) 50 MCG (2000 UT) CAPS Take 2,000 Units by mouth daily.   Yes [provider]  ?Coenzyme Q10 (COQ10) 200 MG CAPS Take 200 mg by mouth 2 (two) times a day.   Yes [provider]  ?gabapentin (NEURONTIN) 300 MG capsule Take 300 mg by mouth at bedtime. 02/02/19  Yes [provider]  ?glucosamine-chondroitin 500-400 MG tablet Take 1 tablet by mouth 2 (two) times a day.    Yes [provider]  ?magnesium gluconate (MAGONATE) 500 MG tablet Take 500 mg by mouth at bedtime.   Yes [provider]  ?Multiple Vitamin (MULTIVITAMIN) capsule Take 1 capsule by mouth daily.   Yes [provider]  ?olmesartan (BENICAR) 20 MG tablet Take 20 mg by mouth daily. 12/16/18  Yes [provider]  ?pravastatin (PRAVACHOL) 40 MG tablet Take 40 mg by mouth at bedtime. 12/15/18  Yes [provider]  ?  sertraline (ZOLOFT) 100 MG tablet Take 100 mg by mouth at bedtime.   Yes [provider]  ?TRULICITY 7.86 LJ/4.4BE SOPN Inject 0.75 mg into the skin every 7 (seven) days. Saturday 02/27/19  Yes [provider]  ?Zinc Methionate 50 MG CAPS Take 50 mg by mouth daily.   Yes [provider]  ?amoxicillin (AMOXIL) 500 MG tablet Take 500 mg by mouth as needed. Take 4 tablets one hour prior to dental procedures    [provider]  ?fluorouracil (EFUDEX) 5 % cream Apply topically 2 (two) times daily. Bid to scalp and temples for 7 days 01/28/22   Ralene Bathe, MD  ?latanoprost (XALATAN)  0.005 % ophthalmic solution Place 1 drop into both eyes in the morning.     [provider]  ?oxyCODONE (OXY IR/ROXICODONE) 5 MG immediate release tablet Take 1-2 tablets (5-10 mg total) by mouth every 4 (four) hours as needed for moderate pain or severe pain (pain score 4-6). ?Patient not taking: Reported on 02/02/2022 10/02/20   Poggi, Marshall Cork, MD  ?sildenafil (REVATIO) 20 MG tablet Take 20 mg by mouth daily as needed (ED).     [provider]  ? ? ?Allergies as of 01/08/2022  ? (No Known Allergies)  ? ? ?History reviewed. No pertinent family history. ? ?Social History  ? ?Socioeconomic History  ? Marital status: Widowed  ?  Spouse name: Not on file  ? Number of children: Not on file  ? Years of education: Not on file  ? Highest education level: Not on file  ?Occupational History  ? Not on file  ?Tobacco Use  ? Smoking status: Never  ? Smokeless tobacco: Current  ?  Types: Chew  ?Vaping Use  ? Vaping Use: Never used  ?Substance and Sexual Activity  ? Alcohol use: Never  ? Drug use: Never  ? Sexual activity: Yes  ?Other Topics Concern  ? Not on file  ?Social History Narrative  ? Not on file  ? ?Social Determinants of Health  ? ?Financial Resource Strain: Not on file  ?Food Insecurity: Not on file  ?Transportation Needs: Not on file  ?Physical Activity: Not on file  ?Stress: Not on file  ?Social Connections: Not on file  ?Intimate Partner Violence: Not on file  ? ? ?Review of Systems: ?See HPI, otherwise negative ROS ? ?Physical Exam: ?BP (!) 178/85   Pulse (!) 256   Temp 97.9 ?F (36.6 ?C) (Temporal)   Ht '5\' 9"'$  (1.753 m)   Wt 96.6 kg   SpO2 97%   BMI 31.45 kg/m?  ?General:   Alert, cooperative in NAD ?Head:  Normocephalic and atraumatic. ?Respiratory:  Normal work of breathing. ?Cardiovascular:  RRR ? ?Impression/Plan: ?Lawrence Patterson is here for cataract surgery. ? ?Risks, benefits, limitations, and alternatives regarding cataract surgery have been reviewed with the patient.  Questions have  been answered.  All parties agreeable. ? ? ?Benay Pillow, MD  02/22/2022, 9:29 AM ? ? ?

## 2022-02-22 NOTE — Op Note (Signed)
OPERATIVE NOTE ? ?[place] ?[id number] ?02/22/2022 ? ? ?PREOPERATIVE DIAGNOSIS:  Nuclear sclerotic cataract left eye.  H25.12 ?  ?POSTOPERATIVE DIAGNOSIS:    Nuclear sclerotic cataract left eye.   ?  ?PROCEDURE:  Phacoemusification with posterior chamber intraocular lens placement of the left eye  ? ?LENS:   ?Implant Name Type Inv. Item Serial No. Manufacturer Lot No. LRB No. Used Action  ?LENS IOL TECNIS EYHANCE 19.0 - [id number] Intraocular Lens LENS IOL TECNIS EYHANCE 19.0 [id number] [employer]  Left 1 Implanted  ?    ?Procedure(s) with comments: ?CATARACT EXTRACTION PHACO AND INTRAOCULAR LENS PLACEMENT (IOC) LEFT DIABETIC 6.73 00:42.6 (Left) - Diabetic ? ?DIB00 +19.0 ?  ?ULTRASOUND TIME: 0 minutes 42 seconds.  CDE 6.73 ?  ?SURGEON:  Benay Pillow, MD, MPH ?  ?ANESTHESIA:  Topical with tetracaine drops augmented with 1% preservative-free intracameral lidocaine. ? ?ESTIMATED BLOOD LOSS: <1 mL ?  ?COMPLICATIONS:  None. ?  ?DESCRIPTION OF PROCEDURE:  The patient was identified in the holding room and transported to the operating room and placed in the supine position under the operating microscope.  The left eye was identified as the operative eye and it was prepped and draped in the usual sterile ophthalmic fashion. ?  ?A 1.0 millimeter clear-corneal paracentesis was made at the 5:00 position. 0.5 ml of preservative-free 1% lidocaine with epinephrine was injected into the anterior chamber. ? The anterior chamber was filled with Healon 5 viscoelastic.  A 2.4 millimeter keratome was used to make a near-clear corneal incision at the 2:00 position.  A curvilinear capsulorrhexis was made with a cystotome and capsulorrhexis forceps.  Balanced salt solution was used to hydrodissect and hydrodelineate the nucleus. ?  ?Phacoemulsification was then used in stop and chop fashion to remove the lens nucleus and epinucleus.  The remaining cortex was then removed using the irrigation and aspiration handpiece. Healon was then  placed into the capsular bag to distend it for lens placement.  A lens was then injected into the capsular bag.  The remaining viscoelastic was aspirated. ?  ?Wounds were hydrated with balanced salt solution.  The anterior chamber was inflated to a physiologic pressure with balanced salt solution. ? ?Intracameral vigamox 0.1 mL undiltued was injected into the eye and a drop placed onto the ocular surface. ? No wound leaks were noted.  The patient was taken to the recovery room in stable condition without complications of anesthesia or surgery ? ?Benay Pillow ?02/22/2022, 9:55 AM ? ?

## 2022-02-22 NOTE — Transfer of Care (Signed)
Immediate Anesthesia Transfer of Care Note ? ?Patient: Lawrence Patterson ? ?Procedure(s) Performed: CATARACT EXTRACTION PHACO AND INTRAOCULAR LENS PLACEMENT (IOC) LEFT DIABETIC 6.73 00:42.6 (Left: Eye) ? ?Patient Location: PACU ? ?Anesthesia Type: MAC ? ?Level of Consciousness: awake, alert  and patient cooperative ? ?Airway and Oxygen Therapy: Patient Spontanous Breathing and Patient connected to supplemental oxygen ? ?Post-op Assessment: Post-op Vital signs reviewed, Patient's Cardiovascular Status Stable, Respiratory Function Stable, Patent Airway and No signs of Nausea or vomiting ? ?Post-op Vital Signs: Reviewed and stable ? ?Complications: No notable events documented. ? ?

## 2022-02-22 NOTE — Anesthesia Postprocedure Evaluation (Signed)
Anesthesia Post Note ? ?Patient: Blayn K [place] ? ?Procedure(s) Performed: CATARACT EXTRACTION PHACO AND INTRAOCULAR LENS PLACEMENT (IOC) LEFT DIABETIC 6.73 00:42.6 (Left: Eye) ? ? ?  ?Patient location during evaluation: PACU ?Anesthesia Type: MAC ?Level of consciousness: awake and alert ?Pain management: pain level controlled ?Vital Signs Assessment: post-procedure vital signs reviewed and stable ?Respiratory status: spontaneous breathing ?Cardiovascular status: blood pressure returned to baseline ?Postop Assessment: no apparent nausea or vomiting, adequate PO intake and no headache ?Anesthetic complications: no ? ? ?No notable events documented. ? ?Adele Barthel Craig Wisnewski ? ? ? ? ? ?

## 2022-02-22 NOTE — Anesthesia Preprocedure Evaluation (Signed)
Anesthesia Evaluation  ?Patient identified by MRN, date of birth, ID band ?Patient awake ? ? ? ?History of Anesthesia Complications ?Negative for: history of anesthetic complications ? ?Airway ?Mallampati: II ? ?TM Distance: >3 FB ?Neck ROM: Full ? ? ? Dental ?no notable dental hx. ? ?  ?Pulmonary ?neg pulmonary ROS,  ?  ?Pulmonary exam normal ? ? ? ? ? ? ? Cardiovascular ?Exercise Tolerance: Good ?hypertension, Pt. on home beta blockers and Pt. on medications ?Normal cardiovascular exam ? ? ?  ?Neuro/Psych ?negative neurological ROS ?   ? GI/Hepatic ?negative GI ROS, Neg liver ROS,   ?Endo/Other  ?diabetes, Well Controlled, Type 2 ? Renal/GU ?Renal InsufficiencyRenal disease (nephrotic syndrome)  ? ?  ?Musculoskeletal ? ? Abdominal ?  ?Peds ? Hematology ?  ?Anesthesia Other Findings ? ? Reproductive/Obstetrics ? ?  ? ? ? ? ? ? ? ? ? ? ? ? ? ?  ?  ? ? ? ? ? ? ? ?Anesthesia Physical ?Anesthesia Plan ? ?ASA: 2 ? ?Anesthesia Plan: MAC  ? ?Post-op Pain Management: Minimal or no pain anticipated  ? ?Induction:  ? ?PONV Risk Score and Plan: 1 and TIVA and Midazolam ? ?Airway Management Planned: Nasal Cannula and Natural Airway ? ?Additional Equipment: None ? ?Intra-op Plan:  ? ?Post-operative Plan:  ? ?Informed Consent: I have reviewed the patients History and Physical, chart, labs and discussed the procedure including the risks, benefits and alternatives for the proposed anesthesia with the patient or authorized representative who has indicated his/her understanding and acceptance.  ? ? ? ? ? ?Plan Discussed with: CRNA ? ?Anesthesia Plan Comments:   ? ? ? ? ? ? ?Anesthesia Quick Evaluation ? ?

## 2022-02-23 ENCOUNTER — Encounter: Payer: Self-pay | Admitting: Ophthalmology

## 2022-06-22 ENCOUNTER — Ambulatory Visit (INDEPENDENT_AMBULATORY_CARE_PROVIDER_SITE_OTHER): Payer: Medicare Other | Admitting: Dermatology

## 2022-06-22 DIAGNOSIS — K130 Diseases of lips: Secondary | ICD-10-CM

## 2022-06-22 MED ORDER — KETOCONAZOLE 2 % EX CREA: 1.0000 | TOPICAL_CREAM | Freq: Two times a day (BID) | CUTANEOUS | 1 refills | Status: AC

## 2022-06-22 MED ORDER — HYDROCORTISONE 2.5 % EX CREA: TOPICAL_CREAM | Freq: Two times a day (BID) | CUTANEOUS | 1 refills | Status: DC

## 2022-06-22 MED ORDER — MUPIROCIN 2 % EX OINT: 1.0000 | TOPICAL_OINTMENT | Freq: Two times a day (BID) | CUTANEOUS | 0 refills | Status: DC

## 2022-06-22 NOTE — Progress Notes (Signed)
   Follow-Up Visit   Subjective  Lawrence Patterson is a 71 y.o. male who presents for the following: Rash (Patient with rash at corner of mouth. His dentist prescribed a cream for him many years ago that helped but it is too expensive now so he was given clotrimazole/betamethasone which does not help at all. Bump next to area of rash came up a few weeks ago.).  Patient usually sees Dr. Nehemiah Massed.   The following portions of the chart were reviewed this encounter and updated as appropriate:   Tobacco  Allergies  Meds  Problems  Med Hx  Surg Hx  Fam Hx      Review of Systems:  No other skin or systemic complaints except as noted in HPI or Assessment and Plan.  Objective  Well appearing patient in no apparent distress; mood and affect are within normal limits.  A focused examination was performed including face. Relevant physical exam findings are noted in the Assessment and Plan.  Lips fissure at right lower cutaneous lip    Assessment & Plan  Angular cheilitis Lips  Start  ketoconazole 2% cream twice daily. Start HC 2.5% cream twice daily.  Start mupirocin ointment twice daily following creams.   Recheck at follow up.   If not improving in 1 week patient will let us know and we can send in [facility].   ketoconazole (NIZORAL) 2 % cream - Lips Apply 1 Application topically 2 (two) times daily.  hydrocortisone 2.5 % cream - Lips Apply topically 2 (two) times daily.  mupirocin ointment (BACTROBAN) 2 % - Lips Apply 1 Application topically 2 (two) times daily.   Return for as scheduled.  Graciella Belton, RMA, am acting as scribe for Forest Gleason, MD .  Documentation: I have reviewed the above documentation for accuracy and completeness, and I agree with the above.  Forest Gleason, MD

## 2022-06-22 NOTE — Patient Instructions (Addendum)
Start  ketoconazole 2% cream twice daily. Start HC 2.5% cream twice daily.  Start mupirocin ointment twice daily following creams.   Due to recent changes in healthcare laws, you may see results of your pathology and/or laboratory studies on MyChart before the doctors have had a chance to review them. We understand that in some cases there may be results that are confusing or concerning to you. Please understand that not all results are received at the same time and often the doctors may need to interpret multiple results in order to provide you with the best plan of care or course of treatment. Therefore, we ask that you please give [place] 2 business days to thoroughly review all your results before contacting the office for clarification. Should we see a critical lab result, you will be contacted sooner.   If You Need Anything After Your Visit  If you have any questions or concerns for your doctor, please call our main line at [phone] and press option 4 to reach your doctor's medical assistant. If no one answers, please leave a voicemail as directed and we will return your call as soon as possible. Messages left after 4 pm will be answered the following business day.   You may also send [place] a message via [place]. We typically respond to MyChart messages within 1-2 business days.  For prescription refills, please ask your pharmacy to contact our office. Our fax number is [phone].  If you have an urgent issue when the clinic is closed that cannot wait until the next business day, you can page your doctor at the number below.    Please note that while we do our best to be available for urgent issues outside of office hours, we are not available 24/7.   If you have an urgent issue and are unable to reach [place], you may choose to seek medical care at your doctor's office, retail clinic, urgent care center, or emergency room.  If you have a medical emergency, please immediately call 911 or go to  the emergency department.  Pager Numbers  - Dr. Nehemiah Massed: [phone]  - Dr. Laurence Ferrari: [phone]  - Dr. Nicole Kindred: [phone]  In the event of inclement weather, please call our main line at [phone] for an update on the status of any delays or closures.  Dermatology Medication Tips: Please keep the boxes that topical medications come in in order to help keep track of the instructions about where and how to use these. Pharmacies typically print the medication instructions only on the boxes and not directly on the medication tubes.   If your medication is too expensive, please contact our office at [phone] option 4 or send [place] a message through [place].   We are unable to tell what your co-pay for medications will be in advance as this is different depending on your insurance coverage. However, we may be able to find a substitute medication at lower cost or fill out paperwork to get insurance to cover a needed medication.   If a prior authorization is required to get your medication covered by your insurance company, please allow [place] 1-2 business days to complete this process.  Drug prices often vary depending on where the prescription is filled and some pharmacies may offer cheaper prices.  The website www.goodrx.com contains coupons for medications through different pharmacies. The prices here do not account for what the cost may be with help from insurance (it may be cheaper with your insurance), but the website can  give you the price if you did not use any insurance.  - You can print the associated coupon and take it with your prescription to the pharmacy.  - You may also stop by our office during regular business hours and pick up a GoodRx coupon card.  - If you need your prescription sent electronically to a different pharmacy, notify our office through [place] MyChart or by phone at [phone] option 4.     Si Usted Necesita Algo Despus de Su Visita  Tambin puede  enviarnos un mensaje a travs de MyChart. Por lo general respondemos a los mensajes de MyChart en el transcurso de 1 a 2 das hbiles.  Para renovar recetas, por favor pida a su farmacia que se ponga en contacto con nuestra oficina. Nuestro nmero de fax es el [phone].  Si tiene un asunto urgente cuando la clnica est cerrada y que no puede esperar hasta el siguiente da hbil, puede llamar/localizar a su doctor(a) al nmero que aparece a continuacin.   Por favor, tenga en cuenta que aunque hacemos todo lo posible para estar disponibles para asuntos urgentes fuera del horario de oficina, no estamos disponibles las 24 horas del da, los 7 das de la semana.   Si tiene un problema urgente y no puede comunicarse con nosotros, puede optar por buscar atencin mdica  en el consultorio de su doctor(a), en una clnica privada, en un centro de atencin urgente o en una sala de emergencias.  Si tiene una emergencia mdica, por favor llame inmediatamente al 911 o vaya a la sala de emergencias.  Nmeros de bper  - Dr. Kowalski: [phone]  - Dra. Moye: [phone]  - Dra. Stewart: [phone]  En caso de inclemencias del tiempo, por favor llame a nuestra lnea principal al [phone] para una actualizacin sobre el estado de cualquier retraso o cierre.  Consejos para la medicacin en dermatologa: Por favor, guarde las cajas en las que vienen los medicamentos de uso tpico para ayudarle a seguir las instrucciones sobre dnde y cmo usarlos. Las farmacias generalmente imprimen las instrucciones del medicamento slo en las cajas y no directamente en los tubos del medicamento.   Si su medicamento es muy caro, por favor, pngase en contacto con nuestra oficina llamando al [phone] y presione la opcin 4 o envenos un mensaje a travs de MyChart.   No podemos decirle cul ser su copago por los medicamentos por adelantado ya que esto es diferente dependiendo de la cobertura de su seguro.  Sin embargo, es posible que podamos encontrar un medicamento sustituto a menor costo o llenar un formulario para que el seguro cubra el medicamento que se considera necesario.   Si se requiere una autorizacin previa para que su compaa de seguros cubra su medicamento, por favor permtanos de 1 a 2 das hbiles para completar este proceso.  Los precios de los medicamentos varan con frecuencia dependiendo del lugar de dnde se surte la receta y alguna farmacias pueden ofrecer precios ms baratos.  El sitio web www.goodrx.com tiene cupones para medicamentos de diferentes farmacias. Los precios aqu no tienen en cuenta lo que podra costar con la ayuda del seguro (puede ser ms barato con su seguro), pero el sitio web puede darle el precio si no utiliz ningn seguro.  - Puede imprimir el cupn correspondiente y llevarlo con su receta a la farmacia.  - Tambin puede pasar por nuestra oficina durante el horario de atencin regular y recoger una tarjeta de cupones de GoodRx.  -   Si necesita que su receta se enve electrnicamente a una farmacia diferente, informe a nuestra oficina a travs de MyChart de [place] o por telfono llamando al [phone] y presione la opcin 4.  

## 2022-06-28 ENCOUNTER — Telehealth: Payer: Self-pay

## 2022-06-28 NOTE — Telephone Encounter (Signed)
Pt called to let us know Hydrocortisone cream and Ketoconazole cream seems to be helping cheilitis, Dr Laurence Ferrari asked him to call here in 1 week if no better and we will try [facility], discussed with pt since the Hydrocortisone cream and Ketoconazole cream are helping he should continue that treatment, if any changes please call back

## 2022-06-30 ENCOUNTER — Encounter: Payer: Self-pay | Admitting: Dermatology

## 2022-08-12 ENCOUNTER — Ambulatory Visit (INDEPENDENT_AMBULATORY_CARE_PROVIDER_SITE_OTHER): Payer: Medicare Other | Admitting: Dermatology

## 2022-08-12 DIAGNOSIS — L578 Other skin changes due to chronic exposure to nonionizing radiation: Secondary | ICD-10-CM

## 2022-08-12 DIAGNOSIS — K130 Diseases of lips: Secondary | ICD-10-CM | POA: Diagnosis not present

## 2022-08-12 DIAGNOSIS — Z5111 Encounter for antineoplastic chemotherapy: Secondary | ICD-10-CM

## 2022-08-12 DIAGNOSIS — L570 Actinic keratosis: Secondary | ICD-10-CM

## 2022-08-12 DIAGNOSIS — Z79899 Other long term (current) drug therapy: Secondary | ICD-10-CM

## 2022-08-12 MED ORDER — HYDROCORTISONE 2.5 % EX CREA: TOPICAL_CREAM | CUTANEOUS | 4 refills | Status: DC

## 2022-08-12 MED ORDER — FLUOROURACIL 5 % EX CREA: TOPICAL_CREAM | Freq: Two times a day (BID) | CUTANEOUS | 1 refills | Status: DC

## 2022-08-12 NOTE — Patient Instructions (Addendum)
Cryotherapy Aftercare  Wash gently with soap and water everyday.   Apply Vaseline and Band-Aid daily until healed.     - On September 12 2022,- Start 5-fluorouracil/calcipotriene cream twice a day for 7 days to affected areas including scalp. And then December 13 2022, treat scalp again twice a day for 7 days. Prescription sent to [facility].  Instructions for [facility] Medications  One or more of your medications was sent to the [facility] mail order compounding pharmacy. You will receive an email from them and can purchase the medicine through that link. It will then be mailed to your home at the address you confirmed. If for any reason you do not receive an email from them, please check your spam folder. If you still do not find the email, please let us know. [facility] phone number is [phone].         Due to recent changes in healthcare laws, you may see results of your pathology and/or laboratory studies on MyChart before the doctors have had a chance to review them. We understand that in some cases there may be results that are confusing or concerning to you. Please understand that not all results are received at the same time and often the doctors may need to interpret multiple results in order to provide you with the best plan of care or course of treatment. Therefore, we ask that you please give [place] 2 business days to thoroughly review all your results before contacting the office for clarification. Should we see a critical lab result, you will be contacted sooner.   If You Need Anything After Your Visit  If you have any questions or concerns for your doctor, please call our main line at [phone] and press option 4 to reach your doctor's medical assistant. If no one answers, please leave a voicemail as directed and we will return your call as soon as possible. Messages left after 4 pm will be answered the following business day.   You  may also send [place] a message via [place]. We typically respond to MyChart messages within 1-2 business days.  For prescription refills, please ask your pharmacy to contact our office. Our fax number is [phone].  If you have an urgent issue when the clinic is closed that cannot wait until the next business day, you can page your doctor at the number below.    Please note that while we do our best to be available for urgent issues outside of office hours, we are not available 24/7.   If you have an urgent issue and are unable to reach [place], you may choose to seek medical care at your doctor's office, retail clinic, urgent care center, or emergency room.  If you have a medical emergency, please immediately call 911 or go to the emergency department.  Pager Numbers  - Dr. Nehemiah Massed: [phone]  - Dr. Laurence Ferrari: [phone]  - Dr. Nicole Kindred: [phone]  In the event of inclement weather, please call our main line at [phone] for an update on the status of any delays or closures.  Dermatology Medication Tips: Please keep the boxes that topical medications come in in order to help keep track of the instructions about where and how to use these. Pharmacies typically print the medication instructions only on the boxes and not directly on the medication tubes.   If your medication is too expensive, please contact our office at [phone] option 4 or send [place] a message through [place].  MyChart.   We are unable to tell what your co-pay for medications will be in advance as this is different depending on your insurance coverage. However, we may be able to find a substitute medication at lower cost or fill out paperwork to get insurance to cover a needed medication.   If a prior authorization is required to get your medication covered by your insurance company, please allow us 1-2 business days to complete this process.  Drug prices often vary depending on where the prescription is filled and  some pharmacies may offer cheaper prices.  The website www.goodrx.com contains coupons for medications through different pharmacies. The prices here do not account for what the cost may be with help from insurance (it may be cheaper with your insurance), but the website can give you the price if you did not use any insurance.  - You can print the associated coupon and take it with your prescription to the pharmacy.  - You may also stop by our office during regular business hours and pick up a GoodRx coupon card.  - If you need your prescription sent electronically to a different pharmacy, notify our office through [place] MyChart or by phone at [phone] option 4.     Si Usted Necesita Algo Despus de Su Visita  Tambin puede enviarnos un mensaje a travs de MyChart. Por lo general respondemos a los mensajes de MyChart en el transcurso de 1 a 2 das hbiles.  Para renovar recetas, por favor pida a su farmacia que se ponga en contacto con nuestra oficina. Nuestro nmero de fax es el [phone].  Si tiene un asunto urgente cuando la clnica est cerrada y que no puede esperar hasta el siguiente da hbil, puede llamar/localizar a su doctor(a) al nmero que aparece a continuacin.   Por favor, tenga en cuenta que aunque hacemos todo lo posible para estar disponibles para asuntos urgentes fuera del horario de oficina, no estamos disponibles las 24 horas del da, los 7 das de la semana.   Si tiene un problema urgente y no puede comunicarse con nosotros, puede optar por buscar atencin mdica  en el consultorio de su doctor(a), en una clnica privada, en un centro de atencin urgente o en una sala de emergencias.  Si tiene una emergencia mdica, por favor llame inmediatamente al 911 o vaya a la sala de emergencias.  Nmeros de bper  - Dr. Kowalski: [phone]  - Dra. Moye: [phone]  - Dra. Stewart: [phone]  En caso de inclemencias del tiempo, por favor llame a nuestra  lnea principal al [phone] para una actualizacin sobre el estado de cualquier retraso o cierre.  Consejos para la medicacin en dermatologa: Por favor, guarde las cajas en las que vienen los medicamentos de uso tpico para ayudarle a seguir las instrucciones sobre dnde y cmo usarlos. Las farmacias generalmente imprimen las instrucciones del medicamento slo en las cajas y no directamente en los tubos del medicamento.   Si su medicamento es muy caro, por favor, pngase en contacto con nuestra oficina llamando al [phone] y presione la opcin 4 o envenos un mensaje a travs de MyChart.   No podemos decirle cul ser su copago por los medicamentos por adelantado ya que esto es diferente dependiendo de la cobertura de su seguro. Sin embargo, es posible que podamos encontrar un medicamento sustituto a menor costo o llenar un formulario para que el seguro cubra el medicamento que se considera necesario.   Si se requiere una autorizacin previa   compaa de seguros [place] su medicamento, por favor permtanos de 1 a 2 das hbiles para completar este proceso.  Los precios de los medicamentos varan con frecuencia dependiendo del Environmental consultant de dnde se surte la receta y alguna farmacias pueden ofrecer precios ms baratos.  El sitio web www.goodrx.com tiene cupones para medicamentos de Airline pilot. Los precios aqu no tienen en cuenta lo que podra costar con la ayuda del seguro (puede ser ms barato con su seguro), pero el sitio web puede darle el precio si no utiliz Research scientist (physical sciences).  - Puede imprimir el cupn correspondiente y llevarlo con su receta a la farmacia.  - Tambin puede pasar por nuestra oficina durante el horario de atencin regular y Charity fundraiser una tarjeta de cupones de GoodRx.  - Si necesita que su receta se enve electrnicamente a una farmacia diferente, informe a nuestra oficina a travs de MyChart de [place] o por telfono llamando al [phone] y presione la opcin  4.

## 2022-08-12 NOTE — Progress Notes (Signed)
Follow-Up Visit   Subjective  Lawrence Patterson is a 71 y.o. male who presents for the following: Actinic Keratosis (Scalp, temples, 14mf/u 5FU/Calcipotriene bid x 7 days) and angular cheilitis (Oral commissures, Ketoconazole 2% cr bid, HC 2.5% cr bid, Mupirocin oint bid, pt has also tried Clotrimazole in past prescribed from dentist but didn't help).  The following portions of the chart were reviewed this encounter and updated as appropriate:   Tobacco  Allergies  Meds  Problems  Med Hx  Surg Hx  Fam Hx     Review of Systems:  No other skin or systemic complaints except as noted in HPI or Assessment and Plan.  Objective  Well appearing patient in no apparent distress; mood and affect are within normal limits.  A focused examination was performed including scalp, face. Relevant physical exam findings are noted in the Assessment and Plan.  oral commissures Erythema oral commissures  Scalp x 17 (17) Pink scaly macules   Assessment & Plan   Actinic Damage - Severe, confluent actinic changes with pre-cancerous actinic keratoses  - Severe, chronic, not at goal, secondary to cumulative UV radiation exposure over time - diffuse scaly erythematous macules and papules with underlying dyspigmentation - Discussed Prescription "Field Treatment" for Severe, Chronic Confluent Actinic Changes with Pre-Cancerous Actinic Keratoses Field treatment involves treatment of an entire area of skin that has confluent Actinic Changes (Sun/ Ultraviolet light damage) and PreCancerous Actinic Keratoses by method of PhotoDynamic Therapy (PDT) and/or prescription Topical Chemotherapy agents such as 5-fluorouracil, 5-fluorouracil/calcipotriene, and/or imiquimod.  The purpose is to decrease the number of clinically evident and subclinical PreCancerous lesions to prevent progression to development of skin cancer by chemically destroying early precancer changes that may or may not be visible.  It has been shown to  reduce the risk of developing skin cancer in the treated area. As a result of treatment, redness, scaling, crusting, and open sores may occur during treatment course. One or more than one of these methods may be used and may have to be used several times to control, suppress and eliminate the PreCancerous changes. Discussed treatment course, expected reaction, and possible side effects. - Recommend daily broad spectrum sunscreen SPF 30+ to sun-exposed areas, reapply every 2 hours as needed.  - Staying in the shade or wearing long sleeves, sun glasses (UVA+UVB protection) and wide brim hats (4-inch brim around the entire circumference of the hat) are also recommended. - Call for new or changing lesions.  - On September 12 2022,- Start 5-fluorouracil/calcipotriene cream twice a day for 7 days to affected areas including scalp. And then December 13 2022, treat scalp again twice a day for 7 days. Prescription sent to [facility]. Patient advised they will receive an email to purchase the medication online and have it sent to their home. Patient provided with handout reviewing treatment course and side effects and advised to call or message [place] MyChart with any concerns.   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 Condition is symptomatic / bothersome to patient. Not to goal.  Discussed I will around casted fillers for this area, $650 for 1 syringe  Start SM Iodoquinol 1% / Hydrocortisone 2.5% / Niacinamide 2% Cream bid 5d/wk prn flares  Patient failed the below treatments: D/C HC 2.5/% cr D/C Ketoconaozle 2% cr D/C Mupirocin oint D/C clotrimazole cream  Related Medications mupirocin ointment (BACTROBAN) 2 % Apply 1 Application topically 2 (two) times  daily.  hydrocortisone 2.5 % cream Apply topically as directed. Qd to Bid to corners of mouth up to 5d/wk prn flares  AK (actinic keratosis)  (17) Scalp x 17 Destruction of lesion - Scalp x 17 Complexity: simple   Destruction method: cryotherapy   Informed consent: discussed and consent obtained   Timeout:  patient name, date of birth, surgical site, and procedure verified Lesion destroyed using liquid nitrogen: Yes   Region frozen until ice ball extended beyond lesion: Yes   Outcome: patient tolerated procedure well with no complications   Post-procedure details: wound care instructions given    Related Medications fluorouracil (EFUDEX) 5 % cream Apply topically 2 (two) times daily. Starting on September 12, 2022 Bid to scalp for 7 days, and then December 13, 2022 bid for 7 days to scalp  Return in about 6 months (around 02/10/2023) for TBSE, Hx of AKs.  I, Othelia Pulling, RMA, am acting as scribe for Sarina Ser, MD . Documentation: I have reviewed the above documentation for accuracy and completeness, and I agree with the above.  Sarina Ser, MD

## 2022-08-20 ENCOUNTER — Encounter: Payer: Self-pay | Admitting: Dermatology

## 2023-01-14 ENCOUNTER — Other Ambulatory Visit: Payer: Self-pay | Admitting: Dermatology

## 2023-01-14 DIAGNOSIS — K130 Diseases of lips: Secondary | ICD-10-CM

## 2023-02-17 ENCOUNTER — Ambulatory Visit (INDEPENDENT_AMBULATORY_CARE_PROVIDER_SITE_OTHER): Payer: Medicare Other | Admitting: Dermatology

## 2023-02-17 VITALS — BP 150/85 | HR 70

## 2023-02-17 DIAGNOSIS — L570 Actinic keratosis: Secondary | ICD-10-CM | POA: Diagnosis not present

## 2023-02-17 DIAGNOSIS — Z7189 Other specified counseling: Secondary | ICD-10-CM

## 2023-02-17 DIAGNOSIS — I872 Venous insufficiency (chronic) (peripheral): Secondary | ICD-10-CM | POA: Diagnosis not present

## 2023-02-17 DIAGNOSIS — Z1283 Encounter for screening for malignant neoplasm of skin: Secondary | ICD-10-CM | POA: Diagnosis not present

## 2023-02-17 DIAGNOSIS — D692 Other nonthrombocytopenic purpura: Secondary | ICD-10-CM

## 2023-02-17 DIAGNOSIS — D225 Melanocytic nevi of trunk: Secondary | ICD-10-CM

## 2023-02-17 DIAGNOSIS — D229 Melanocytic nevi, unspecified: Secondary | ICD-10-CM

## 2023-02-17 DIAGNOSIS — B351 Tinea unguium: Secondary | ICD-10-CM | POA: Diagnosis not present

## 2023-02-17 DIAGNOSIS — B353 Tinea pedis: Secondary | ICD-10-CM | POA: Diagnosis not present

## 2023-02-17 DIAGNOSIS — L578 Other skin changes due to chronic exposure to nonionizing radiation: Secondary | ICD-10-CM

## 2023-02-17 DIAGNOSIS — Z85828 Personal history of other malignant neoplasm of skin: Secondary | ICD-10-CM

## 2023-02-17 DIAGNOSIS — L821 Other seborrheic keratosis: Secondary | ICD-10-CM

## 2023-02-17 DIAGNOSIS — L814 Other melanin hyperpigmentation: Secondary | ICD-10-CM

## 2023-02-17 DIAGNOSIS — L820 Inflamed seborrheic keratosis: Secondary | ICD-10-CM | POA: Diagnosis not present

## 2023-02-17 DIAGNOSIS — Z79899 Other long term (current) drug therapy: Secondary | ICD-10-CM

## 2023-02-17 MED ORDER — TERBINAFINE HCL 250 MG PO TABS: 250.0000 mg | ORAL_TABLET | Freq: Every day | ORAL | 0 refills | Status: DC

## 2023-02-17 NOTE — Patient Instructions (Addendum)
Cryotherapy Aftercare  Wash gently with soap and water everyday.   Apply Vaseline and Band-Aid daily until healed.    Terbinafine Counseling  Terbinafine is an anti-fungal medicine that can be applied to the skin (over the counter) or taken by mouth (prescription) to treat fungal infections. The pill version is often used to treat fungal infections of the nails or scalp. While most people do not have any side effects from taking terbinafine pills, some possible side effects of the medicine can include taste changes, headache, loss of smell, vision changes, nausea, vomiting, or diarrhea.   Rare side effects can include irritation of the liver, allergic reaction, or decrease in blood counts (which may show up as not feeling well or developing an infection). If you are concerned about any of these side effects, please stop the medicine and call your doctor, or in the case of an emergency such as feeling very unwell, seek immediate medical care.    Due to recent changes in healthcare laws, you may see results of your pathology and/or laboratory studies on MyChart before the doctors have had a chance to review them. We understand that in some cases there may be results that are confusing or concerning to you. Please understand that not all results are received at the same time and often the doctors may need to interpret multiple results in order to provide you with the best plan of care or course of treatment. Therefore, we ask that you please give [place] 2 business days to thoroughly review all your results before contacting the office for clarification. Should we see a critical lab result, you will be contacted sooner.   If You Need Anything After Your Visit  If you have any questions or concerns for your doctor, please call our main line at [phone] and press option 4 to reach your doctor's medical assistant. If no one answers, please leave a voicemail as directed and we will return your call as soon  as possible. Messages left after 4 pm will be answered the following business day.   You may also send [place] a message via [place]. We typically respond to MyChart messages within 1-2 business days.  For prescription refills, please ask your pharmacy to contact our office. Our fax number is [phone].  If you have an urgent issue when the clinic is closed that cannot wait until the next business day, you can page your doctor at the number below.    Please note that while we do our best to be available for urgent issues outside of office hours, we are not available 24/7.   If you have an urgent issue and are unable to reach [place], you may choose to seek medical care at your doctor's office, retail clinic, urgent care center, or emergency room.  If you have a medical emergency, please immediately call 911 or go to the emergency department.  Pager Numbers  - Dr. Nehemiah Massed: [phone]  - Dr. Laurence Ferrari: [phone]  - Dr. Nicole Kindred: [phone]  In the event of inclement weather, please call our main line at [phone] for an update on the status of any delays or closures.  Dermatology Medication Tips: Please keep the boxes that topical medications come in in order to help keep track of the instructions about where and how to use these. Pharmacies typically print the medication instructions only on the boxes and not directly on the medication tubes.   If your medication is too expensive, please contact our office at [phone] option  4 or send [place] a message through [place].   We are unable to tell what your co-pay for medications will be in advance as this is different depending on your insurance coverage. However, we may be able to find a substitute medication at lower cost or fill out paperwork to get insurance to cover a needed medication.   If a prior authorization is required to get your medication covered by your insurance company, please allow [place] 1-2 business days to complete this  process.  Drug prices often vary depending on where the prescription is filled and some pharmacies may offer cheaper prices.  The website www.goodrx.com contains coupons for medications through different pharmacies. The prices here do not account for what the cost may be with help from insurance (it may be cheaper with your insurance), but the website can give you the price if you did not use any insurance.  - You can print the associated coupon and take it with your prescription to the pharmacy.  - You may also stop by our office during regular business hours and pick up a GoodRx coupon card.  - If you need your prescription sent electronically to a different pharmacy, notify our office through [facility] or by phone at [phone] option 4.     Si Usted Necesita Algo Despus de Su Visita  Tambin puede enviarnos un mensaje a travs de Pharmacist, community. Por lo general respondemos a los mensajes de MyChart en el transcurso de 1 a 2 das hbiles.  Para renovar recetas, por favor pida a su farmacia que se ponga en contacto con nuestra oficina. Harland Dingwall de fax es [place] [phone].  Si tiene un asunto urgente cuando la clnica est cerrada y que no puede esperar hasta el siguiente da hbil, puede llamar/localizar a su doctor(a) al nmero que aparece a continuacin.   Por favor, tenga en cuenta que aunque hacemos todo lo posible para estar disponibles para asuntos urgentes fuera del horario de [place], no estamos disponibles las 24 horas del da, los 7 das de la [place].   Si tiene un problema urgente y no puede comunicarse con nosotros, puede optar por buscar atencin mdica  en el consultorio de su doctor(a), en una clnica privada, en un centro de atencin urgente o en una sala de emergencias.  Si tiene Engineering geologist, por favor llame inmediatamente al 911 o vaya a la sala de emergencias.  Nmeros de bper  - Dr. Nehemiah Massed: [phone]  - Dra. Moye: [phone]  - Dra.  Nicole Kindred: [phone]  En caso de inclemencias del [place], por favor llame a Johnsie Kindred principal al [phone] para una actualizacin sobre el [place] de cualquier retraso o cierre.  Consejos para la medicacin en dermatologa: Por favor, guarde las cajas en las que vienen los medicamentos de uso tpico para ayudarle a seguir las instrucciones sobre dnde y cmo usarlos. Las farmacias generalmente imprimen las instrucciones del medicamento slo en las cajas y no directamente en los tubos del [place].   Si su medicamento es muy caro, por favor, pngase en contacto con Zigmund Daniel llamando al [phone] y presione la opcin 4 o envenos un mensaje a travs de Pharmacist, community.   No podemos decirle cul ser su copago por los medicamentos por adelantado ya que esto es diferente dependiendo de la cobertura de su seguro. Sin embargo, es posible que podamos encontrar un medicamento sustituto a Electrical engineer un formulario para que el seguro cubra el medicamento que se considera necesario.  Si se requiere una autorizacin previa para que su compaa de seguros [place] su medicamento, por favor permtanos de 1 a 2 das hbiles para completar este proceso.  Los precios de los medicamentos varan con frecuencia dependiendo del Environmental consultant de dnde se surte la receta y alguna farmacias pueden ofrecer precios ms baratos.  El sitio web www.goodrx.com tiene cupones para medicamentos de Airline pilot. Los precios aqu no tienen en cuenta lo que podra costar con la ayuda del seguro (puede ser ms barato con su seguro), pero el sitio web puede darle el precio si no utiliz Research scientist (physical sciences).  - Puede imprimir el cupn correspondiente y llevarlo con su receta a la farmacia.  - Tambin puede pasar por nuestra oficina durante el horario de atencin regular y Charity fundraiser una tarjeta de cupones de GoodRx.  - Si necesita que su receta se enve electrnicamente a una farmacia diferente, informe a nuestra oficina a  travs de MyChart de [place] o por telfono llamando al [phone] y presione la opcin 4.

## 2023-02-17 NOTE — Progress Notes (Signed)
Follow-Up Visit   Subjective  Lawrence Patterson is a 72 y.o. male who presents for the following: Total body skin exam (Hx of BCCs, hx of Aks, pt used 5FU/Calcipotriene to scalp with good rxn since last visit). The patient presents for Total-Body Skin Exam (TBSE) for skin cancer screening and mole check.  The patient has spots, moles and lesions to be evaluated, some may be new or changing and the patient has concerns that these could be cancer.   The following portions of the chart were reviewed this encounter and updated as appropriate:   Tobacco  Allergies  Meds  Problems  Med Hx  Surg Hx  Fam Hx     Review of Systems:  No other skin or systemic complaints except as noted in HPI or Assessment and Plan.  Objective  Well appearing patient in no apparent distress; mood and affect are within normal limits.  A full examination was performed including scalp, head, eyes, ears, nose, lips, neck, chest, axillae, abdomen, back, buttocks, bilateral upper extremities, bilateral lower extremities, hands, feet, fingers, toes, fingernails, and toenails. All findings within normal limits unless otherwise noted below.  Scalp x 1 Pink scaly macules  trunk x 18 (18) Stuck on waxy paps with erythema  bil lower legs Stasis changes bil lower legs  L foot Toenail dystrophy, scaling foot   Assessment & Plan   History of Basal Cell Carcinoma of the Skin - No evidence of recurrence today - Recommend regular full body skin exams - Recommend daily broad spectrum sunscreen SPF 30+ to sun-exposed areas, reapply every 2 hours as needed.  - Call if any new or changing lesions are noted between office visits  - L temple, L lower eyelid  AK (actinic keratosis) Scalp x 1 Good rxn to 5FU/Calcipotriene  Destruction of lesion - Scalp x 1 Complexity: simple   Destruction method: cryotherapy   Informed consent: discussed and consent obtained   Timeout:  patient name, date of birth, surgical site, and  procedure verified Lesion destroyed using liquid nitrogen: Yes   Region frozen until ice ball extended beyond lesion: Yes   Outcome: patient tolerated procedure well with no complications   Post-procedure details: wound care instructions given    Related Medications fluorouracil (EFUDEX) 5 % cream Apply topically 2 (two) times daily. Starting on September 12, 2022 Bid to scalp for 7 days, and then December 13, 2022 bid for 7 days to scalp  Inflamed seborrheic keratosis (18) trunk x 18 Symptomatic, irritating, patient would like treated. Destruction of lesion - trunk x 18 Complexity: simple   Destruction method: cryotherapy   Informed consent: discussed and consent obtained   Timeout:  patient name, date of birth, surgical site, and procedure verified Lesion destroyed using liquid nitrogen: Yes   Region frozen until ice ball extended beyond lesion: Yes   Outcome: patient tolerated procedure well with no complications   Post-procedure details: wound care instructions given    Stasis dermatitis of both legs With [place]'s purpura bil lower legs Stasis in the legs causes chronic leg swelling, which may result in itchy or painful rashes, skin discoloration, skin texture changes, and sometimes ulceration.  Recommend daily graduated compression hose/stockings- easiest to put on first thing in morning, remove at bedtime.  Elevate legs as much as possible. Avoid salt/sodium rich foods.  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 Condition is symptomatic / bothersome to patient. Not to goal.  Labs from 01/31/23 wnl  Start Lamisil '250mg'$  1 po qd  Terbinafine Counseling Terbinafine is an anti-fungal medicine that can be applied to the skin (over the counter) or taken by mouth (prescription) to treat fungal infections. The pill version is often used to treat fungal infections of the nails or scalp. While most people do  not have any side effects from taking terbinafine pills, some possible side effects of the medicine can include taste changes, headache, loss of smell, vision changes, nausea, vomiting, or diarrhea.   Rare side effects can include irritation of the liver, allergic reaction, or decrease in blood counts (which may show up as not feeling well or developing an infection). If you are concerned about any of these side effects, please stop the medicine and call your doctor, or in the case of an emergency such as feeling very unwell, seek immediate medical care.   terbinafine (LAMISIL) 250 MG tablet - L foot Take 1 tablet (250 mg total) by mouth daily.  Lentigines - Scattered tan macules - Due to sun exposure - Benign-appearing, observe - Recommend daily broad spectrum sunscreen SPF 30+ to sun-exposed areas, reapply every 2 hours as needed. - Call for any changes - arms  Seborrheic Keratoses - Stuck-on, waxy, tan-brown papules and/or plaques  - Benign-appearing - Discussed benign etiology and prognosis. - Observe - Call for any changes - arms, back  Melanocytic Nevi - Tan-brown and/or pink-flesh-colored symmetric macules and papules - Benign appearing on exam today - Observation - Call clinic for new or changing moles - Recommend daily use of broad spectrum spf 30+ sunscreen to sun-exposed areas.  - trunk  Hemangiomas - Red papules - Discussed benign nature - Observe - Call for any changes - trunk  Actinic Damage - Chronic condition, secondary to cumulative UV/sun exposure - diffuse scaly erythematous macules with underlying dyspigmentation - Recommend daily broad spectrum sunscreen SPF 30+ to sun-exposed areas, reapply every 2 hours as needed.  - Staying in the shade or wearing long sleeves, sun glasses (UVA+UVB protection) and wide brim hats (4-inch brim around the entire circumference of the hat) are also recommended for sun protection.  - Call for new or changing  lesions.  Skin cancer screening performed today.   Purpura - Chronic; persistent and recurrent.  Treatable, but not curable. - Violaceous macules and patches - Benign - Related to trauma, age, sun damage and/or use of blood thinners, chronic use of topical and/or oral steroids - Observe - Can use OTC arnica containing moisturizer such as Dermend Bruise Formula if desired - Call for worsening or other concerns   Return in about 2 months (around 04/19/2023) for Tinea pedis/Unguium. Documentation: I have reviewed the above documentation for accuracy and completeness, and I agree with the above.  Sarina Ser, MD

## 2023-02-20 ENCOUNTER — Encounter: Payer: Self-pay | Admitting: Dermatology

## 2023-04-20 ENCOUNTER — Ambulatory Visit (INDEPENDENT_AMBULATORY_CARE_PROVIDER_SITE_OTHER): Payer: Medicare Other | Admitting: Dermatology

## 2023-04-20 ENCOUNTER — Encounter: Payer: Self-pay | Admitting: Dermatology

## 2023-04-20 VITALS — BP 172/98 | HR 65

## 2023-04-20 DIAGNOSIS — B353 Tinea pedis: Secondary | ICD-10-CM | POA: Diagnosis not present

## 2023-04-20 DIAGNOSIS — Z7189 Other specified counseling: Secondary | ICD-10-CM

## 2023-04-20 DIAGNOSIS — B351 Tinea unguium: Secondary | ICD-10-CM | POA: Diagnosis not present

## 2023-04-20 DIAGNOSIS — Z79899 Other long term (current) drug therapy: Secondary | ICD-10-CM

## 2023-04-20 MED ORDER — TERBINAFINE HCL 250 MG PO TABS: 250.0000 mg | ORAL_TABLET | Freq: Every day | ORAL | 1 refills | Status: DC

## 2023-04-20 NOTE — Progress Notes (Signed)
   Follow-Up Visit   Subjective  Lawrence Patterson is a 72 y.o. male who presents for the following: follow up on tinea unguium    The following portions of the chart were reviewed this encounter and updated as appropriate: medications, allergies, medical history  Review of Systems:  No other skin or systemic complaints except as noted in HPI or Assessment and Plan.  Objective  Well appearing patient in no apparent distress; mood and affect are within normal limits.   A focused examination was performed of the following areas: Both feet   Relevant exam findings are noted in the Assessment and Plan.    Assessment & Plan   Tinea unguium feet  With Tinea Pedis Chronic and persistent condition with duration or expected duration over one year. Condition is symptomatic / bothersome to patient. Not to goal. Some improvement of scale at feet  Discussed can take time for toenails to see results    Continue with 2 more months  Lamisil 250mg  1 po qd   Terbinafine Counseling Terbinafine is an anti-fungal medicine that can be applied to the skin (over the counter) or taken by mouth (prescription) to treat fungal infections. The pill version is often used to treat fungal infections of the nails or scalp. While most people do not have any side effects from taking terbinafine pills, some possible side effects of the medicine can include taste changes, headache, loss of smell, vision changes, nausea, vomiting, or diarrhea.    Rare side effects can include irritation of the liver, allergic reaction, or decrease in blood counts (which may show up as not feeling well or developing an infection). If you are concerned about any of these side effects, please stop the medicine and call your doctor, or in the case of an emergency such as feeling very unwell, seek immediate medical care.   terbinafine (LAMISIL) 250 MG tablet - L foot Take 1 tablet (250 mg total) by mouth daily.  Tinea unguium  Related  Medications terbinafine (LAMISIL) 250 MG tablet Take 1 tablet (250 mg total) by mouth daily.    Return in about 1 year (around 04/19/2024) for TBSE and follow up with feet .  IAsher Muir, CMA, am acting as scribe for Armida Sans, MD.   Documentation: I have reviewed the above documentation for accuracy and completeness, and I agree with the above.  Armida Sans, MD

## 2023-04-20 NOTE — Patient Instructions (Addendum)
Terbinafine Counseling  Terbinafine is an anti-fungal medicine that can be applied to the skin (over the counter) or taken by mouth (prescription) to treat fungal infections. The pill version is often used to treat fungal infections of the nails or scalp. While most people do not have any side effects from taking terbinafine pills, some possible side effects of the medicine can include taste changes, headache, loss of smell, vision changes, nausea, vomiting, or diarrhea.   Rare side effects can include irritation of the liver, allergic reaction, or decrease in blood counts (which may show up as not feeling well or developing an infection). If you are concerned about any of these side effects, please stop the medicine and call your doctor, or in the case of an emergency such as feeling very unwell, seek immediate medical care.    Due to recent changes in healthcare laws, you may see results of your pathology and/or laboratory studies on MyChart before the doctors have had a chance to review them. We understand that in some cases there may be results that are confusing or concerning to you. Please understand that not all results are received at the same time and often the doctors may need to interpret multiple results in order to provide you with the best plan of care or course of treatment. Therefore, we ask that you please give us 2 business days to thoroughly review all your results before contacting the office for clarification. Should we see a critical lab result, you will be contacted sooner.   If You Need Anything After Your Visit  If you have any questions or concerns for your doctor, please call our main line at [phone] and press option 4 to reach your doctor's medical assistant. If no one answers, please leave a voicemail as directed and we will return your call as soon as possible. Messages left after 4 pm will be answered the following business day.   You may also send us a message via  MyChart. We typically respond to MyChart messages within 1-2 business days.  For prescription refills, please ask your pharmacy to contact our office. Our fax number is [phone].  If you have an urgent issue when the clinic is closed that cannot wait until the next business day, you can page your doctor at the number below.    Please note that while we do our best to be available for urgent issues outside of office hours, we are not available 24/7.   If you have an urgent issue and are unable to reach us, you may choose to seek medical care at your doctor's office, retail clinic, urgent care center, or emergency room.  If you have a medical emergency, please immediately call 911 or go to the emergency department.  Pager Numbers  - Dr. Kowalski: [phone]  - Dr. Moye: [phone]  - Dr. Stewart: [phone]  In the event of inclement weather, please call our main line at [phone] for an update on the status of any delays or closures.  Dermatology Medication Tips: Please keep the boxes that topical medications come in in order to help keep track of the instructions about where and how to use these. Pharmacies typically print the medication instructions only on the boxes and not directly on the medication tubes.   If your medication is too expensive, please contact our office at [phone] option 4 or send us a message through MyChart.   We are unable to tell what your co-pay for medications will be   in advance as this is different depending on your insurance coverage. However, we may be able to find a substitute medication at lower cost or fill out paperwork to get insurance to cover a needed medication.   If a prior authorization is required to get your medication covered by your insurance company, please allow us 1-2 business days to complete this process.  Drug prices often vary depending on where the prescription is filled and some pharmacies may offer cheaper  prices.  The website www.goodrx.com contains coupons for medications through different pharmacies. The prices here do not account for what the cost may be with help from insurance (it may be cheaper with your insurance), but the website can give you the price if you did not use any insurance.  - You can print the associated coupon and take it with your prescription to the pharmacy.  - You may also stop by our office during regular business hours and pick up a GoodRx coupon card.  - If you need your prescription sent electronically to a different pharmacy, notify our office through [facility] MyChart or by phone at [phone] option 4.     Si Usted Necesita Algo Despus de Su Visita  Tambin puede enviarnos un mensaje a travs de MyChart. Por lo general respondemos a los mensajes de MyChart en el transcurso de 1 a 2 das hbiles.  Para renovar recetas, por favor pida a su farmacia que se ponga en contacto con nuestra oficina. Nuestro nmero de fax es el [phone].  Si tiene un asunto urgente cuando la clnica est cerrada y que no puede esperar hasta el siguiente da hbil, puede llamar/localizar a su doctor(a) al nmero que aparece a continuacin.   Por favor, tenga en cuenta que aunque hacemos todo lo posible para estar disponibles para asuntos urgentes fuera del horario de oficina, no estamos disponibles las 24 horas del da, los 7 das de la semana.   Si tiene un problema urgente y no puede comunicarse con nosotros, puede optar por buscar atencin mdica  en el consultorio de su doctor(a), en una clnica privada, en un centro de atencin urgente o en una sala de emergencias.  Si tiene una emergencia mdica, por favor llame inmediatamente al 911 o vaya a la sala de emergencias.  Nmeros de bper  - Dr. Kowalski: [phone]  - Dra. Moye: [phone]  - Dra. Stewart: [phone]  En caso de inclemencias del tiempo, por favor llame a nuestra lnea principal al [phone]  para una actualizacin sobre el estado de cualquier retraso o cierre.  Consejos para la medicacin en dermatologa: Por favor, guarde las cajas en las que vienen los medicamentos de uso tpico para ayudarle a seguir las instrucciones sobre dnde y cmo usarlos. Las farmacias generalmente imprimen las instrucciones del medicamento slo en las cajas y no directamente en los tubos del medicamento.   Si su medicamento es muy caro, por favor, pngase en contacto con nuestra oficina llamando al [phone] y presione la opcin 4 o envenos un mensaje a travs de MyChart.   No podemos decirle cul ser su copago por los medicamentos por adelantado ya que esto es diferente dependiendo de la cobertura de su seguro. Sin embargo, es posible que podamos encontrar un medicamento sustituto a menor costo o llenar un formulario para que el seguro cubra el medicamento que se considera necesario.   Si se requiere una autorizacin previa para que su compaa de seguros cubra su medicamento, por favor permtanos de 1 a   2 das hbiles para completar este proceso.  Los precios de los medicamentos varan con frecuencia dependiendo del lugar de dnde se surte la receta y alguna farmacias pueden ofrecer precios ms baratos.  El sitio web www.goodrx.com tiene cupones para medicamentos de diferentes farmacias. Los precios aqu no tienen en cuenta lo que podra costar con la ayuda del seguro (puede ser ms barato con su seguro), pero el sitio web puede darle el precio si no utiliz ningn seguro.  - Puede imprimir el cupn correspondiente y llevarlo con su receta a la farmacia.  - Tambin puede pasar por nuestra oficina durante el horario de atencin regular y recoger una tarjeta de cupones de GoodRx.  - Si necesita que su receta se enve electrnicamente a una farmacia diferente, informe a nuestra oficina a travs de MyChart de [place] o por telfono llamando al [phone] y presione la opcin 4.  

## 2023-04-26 ENCOUNTER — Encounter: Payer: Self-pay | Admitting: Dermatology

## 2023-06-28 ENCOUNTER — Encounter: Payer: Self-pay | Admitting: Gastroenterology

## 2023-07-04 ENCOUNTER — Encounter: Payer: Self-pay | Admitting: Gastroenterology

## 2023-07-04 ENCOUNTER — Other Ambulatory Visit: Payer: Self-pay

## 2023-07-04 ENCOUNTER — Ambulatory Visit
Admission: RE | Admit: 2023-07-04 | Discharge: 2023-07-04 | Disposition: A | Discharge status: Home / Self Care | Payer: Medicare Other | Attending: Gastroenterology | Admitting: Gastroenterology

## 2023-07-04 ENCOUNTER — Ambulatory Visit: Payer: Medicare Other | Admitting: Certified Registered"

## 2023-07-04 ENCOUNTER — Encounter
Admission: RE | Disposition: A | Discharge status: Home / Self Care | Payer: Self-pay | Source: Home / Self Care | Attending: Gastroenterology

## 2023-07-04 DIAGNOSIS — I129 Hypertensive chronic kidney disease with stage 1 through stage 4 chronic kidney disease, or unspecified chronic kidney disease: Secondary | ICD-10-CM | POA: Diagnosis not present

## 2023-07-04 DIAGNOSIS — D124 Benign neoplasm of descending colon: Secondary | ICD-10-CM | POA: Diagnosis not present

## 2023-07-04 DIAGNOSIS — K640 First degree hemorrhoids: Secondary | ICD-10-CM | POA: Diagnosis not present

## 2023-07-04 DIAGNOSIS — E1151 Type 2 diabetes mellitus with diabetic peripheral angiopathy without gangrene: Secondary | ICD-10-CM | POA: Insufficient documentation

## 2023-07-04 DIAGNOSIS — D122 Benign neoplasm of ascending colon: Secondary | ICD-10-CM | POA: Insufficient documentation

## 2023-07-04 DIAGNOSIS — Z08 Encounter for follow-up examination after completed treatment for malignant neoplasm: Secondary | ICD-10-CM | POA: Insufficient documentation

## 2023-07-04 DIAGNOSIS — N189 Chronic kidney disease, unspecified: Secondary | ICD-10-CM | POA: Insufficient documentation

## 2023-07-04 DIAGNOSIS — Z79899 Other long term (current) drug therapy: Secondary | ICD-10-CM | POA: Diagnosis not present

## 2023-07-04 DIAGNOSIS — D128 Benign neoplasm of rectum: Secondary | ICD-10-CM | POA: Diagnosis not present

## 2023-07-04 DIAGNOSIS — Z85828 Personal history of other malignant neoplasm of skin: Secondary | ICD-10-CM | POA: Insufficient documentation

## 2023-07-04 DIAGNOSIS — D125 Benign neoplasm of sigmoid colon: Secondary | ICD-10-CM | POA: Diagnosis not present

## 2023-07-04 DIAGNOSIS — E1122 Type 2 diabetes mellitus with diabetic chronic kidney disease: Secondary | ICD-10-CM | POA: Diagnosis not present

## 2023-07-04 DIAGNOSIS — Z7985 Long-term (current) use of injectable non-insulin antidiabetic drugs: Secondary | ICD-10-CM | POA: Insufficient documentation

## 2023-07-04 DIAGNOSIS — Z1211 Encounter for screening for malignant neoplasm of colon: Secondary | ICD-10-CM | POA: Diagnosis not present

## 2023-07-04 DIAGNOSIS — D123 Benign neoplasm of transverse colon: Secondary | ICD-10-CM | POA: Insufficient documentation

## 2023-07-04 HISTORY — PX: POLYPECTOMY: SHX5525

## 2023-07-04 HISTORY — PX: COLONOSCOPY: SHX5424

## 2023-07-04 LAB — GLUCOSE, CAPILLARY: Glucose-Capillary: 121 mg/dL — ABNORMAL HIGH (ref 70–99)

## 2023-07-04 SURGERY — COLONOSCOPY
Anesthesia: General

## 2023-07-04 MED ORDER — SODIUM CHLORIDE 0.9 % IV SOLN: INTRAVENOUS | Status: DC

## 2023-07-04 MED ORDER — LIDOCAINE HCL (CARDIAC) PF 100 MG/5ML IV SOSY
PREFILLED_SYRINGE | INTRAVENOUS | Status: DC | PRN
  Administered 2023-07-04: 50 mg via INTRAVENOUS

## 2023-07-04 MED ORDER — PROPOFOL 500 MG/50ML IV EMUL
INTRAVENOUS | Status: DC | PRN
  Administered 2023-07-04: 150 ug/kg/min via INTRAVENOUS

## 2023-07-04 MED ORDER — PROPOFOL 10 MG/ML IV BOLUS
INTRAVENOUS | Status: DC | PRN
Start: 2023-07-04 — End: 2023-07-04
  Administered 2023-07-04: 80 mg via INTRAVENOUS

## 2023-07-04 MED ORDER — LIDOCAINE HCL (PF) 1 % IJ SOLN
INTRAMUSCULAR | Status: AC
  Filled 2023-07-04: qty 2

## 2023-07-04 MED ORDER — SIMETHICONE 40 MG/0.6ML PO SUSP
ORAL | Status: DC | PRN
  Administered 2023-07-04: 60 mL

## 2023-07-04 NOTE — Transfer of Care (Signed)
Immediate Anesthesia Transfer of Care Note  Patient: Lawrence Patterson  Procedure(s) Performed: COLONOSCOPY POLYPECTOMY  Patient Location: PACU and Endoscopy Unit  Anesthesia Type:General  Level of Consciousness: awake  Airway & Oxygen Therapy: Patient Spontanous Breathing  Post-op Assessment: Report given to RN and Post -op Vital signs reviewed and stable  Post vital signs: Reviewed and stable  Last Vitals:  Vitals Value Taken Time  BP 151/83 07/04/23 1125  Temp    Pulse 66 07/04/23 1126  Resp 19 07/04/23 1126  SpO2 98 % 07/04/23 1126  Vitals shown include unfiled device data.  Last Pain:  Vitals:   07/04/23 1001  TempSrc: Temporal  PainSc: 0-No pain         Complications: No notable events documented.

## 2023-07-04 NOTE — H&P (Signed)
Pre-Procedure H&P   Patient ID: Lawrence Patterson is a 72 y.o. male.  Gastroenterology Provider: Jaynie Collins, DO  Referring Provider: Fransico Setters, NP PCP: Danella Penton, MD  Date: 07/04/2023  HPI Lawrence Patterson is a 72 y.o. male who presents today for Colonoscopy for Colorectal cancer screening .  Last underwent colonoscopy in February 2014 only demonstrating internal hemorrhoids.  Reports regular bowel movements without melena hematochezia diarrhea or constipation  Hemoglobin 12.6 MCV 99 platelets 234,000 creatinine 1.5 A1c 6.5  Currently taking Trulicity which been held for this procedure (last dose 06/25/23)   Past Medical History:  Diagnosis Date   Actinic keratosis    Arthritis    Basal cell carcinoma 09/08/2020   left lower eyelid    Basal cell carcinoma 07/14/2021   Left temple   Chronic kidney disease    increase protein/ diabetes   Complication of anesthesia    uvular trauma.  Required a uvulectomy   Diabetes mellitus (HCC)    Hypertension    Proteinuria    PVD (peripheral vascular disease) [facility])    vericosity(had surgery)    Past Surgical History:  Procedure Laterality Date   CATARACT EXTRACTION W/PHACO Right 02/08/2022   Procedure: CATARACT EXTRACTION PHACO AND INTRAOCULAR LENS PLACEMENT (IOC) RIGHT DIABETIC;  Surgeon: Nevada Crane, MD;  Location: [facility];  Service: Ophthalmology;  Laterality: Right;  8.14 0:48.6   CATARACT EXTRACTION W/PHACO Left 02/22/2022   Procedure: CATARACT EXTRACTION PHACO AND INTRAOCULAR LENS PLACEMENT (IOC) LEFT DIABETIC 6.73 00:42.6;  Surgeon: Nevada Crane, MD;  Location: [facility];  Service: Ophthalmology;  Laterality: Left;  Diabetic   KNEE ARTHROPLASTY Right 05/07/2019   Procedure: COMPUTER ASSISTED TOTAL KNEE ARTHROPLASTY RIGHT;  Surgeon: Donato Heinz, MD;  Location: [facility] OR[facility];  Service: Orthopedics;  Laterality: Right;   KNEE ARTHROSCOPY Left    PILONIDAL CYST EXCISION      PILONIDAL CYST EXCISION     x2   ROTATOR CUFF REPAIR Right    SHOULDER ARTHROSCOPY WITH OPEN ROTATOR CUFF REPAIR Right 10/02/2020   Procedure: RIGHT SHOULDER ARTHROSCOPY WITH DEBRIDEMENT, DECOMPRESSION, REPAIR OF LARGE RECURRENT ROTATOR CUFF TEAR, AND POSSIBLE BICEPS TENODESIS.;  Surgeon: Christena Flake, MD;  Location: [facility] ORS;  Service: Orthopedics;  Laterality: Right;   TONSILLECTOMY     UVULECTOMY     VARICOSE VEIN SURGERY Left 1992   VASECTOMY  1990   VEIN SURGERY     laser    Family History No h/o GI disease or malignancy  Review of Systems  Constitutional:  Negative for activity change, appetite change, chills, diaphoresis, fatigue, fever and unexpected weight change.  HENT:  Negative for trouble swallowing and voice change.   Respiratory:  Negative for shortness of breath and wheezing.   Cardiovascular:  Negative for chest pain, palpitations and leg swelling.  Gastrointestinal:  Negative for abdominal distention, abdominal pain, anal bleeding, blood in stool, constipation, diarrhea, nausea and vomiting.  Musculoskeletal:  Negative for arthralgias and myalgias.  Skin:  Negative for color change and pallor.  Neurological:  Negative for dizziness, syncope and weakness.  Psychiatric/Behavioral:  Negative for confusion. The patient is not nervous/anxious.   All other systems reviewed and are negative.    Medications No current facility-administered medications on file prior to encounter.   Current Outpatient Medications on File Prior to Encounter  Medication Sig Dispense Refill   acetaminophen (TYLENOL) 500 MG tablet Take 1,000 mg by mouth 2 (two) times daily as needed  for mild pain or headache.     allopurinol (ZYLOPRIM) 300 MG tablet Take 300 mg by mouth daily.     amLODipine (NORVASC) 10 MG tablet Take 10 mg by mouth daily.     amoxicillin (AMOXIL) 500 MG tablet Take 500 mg by mouth as needed. Take 4 tablets one hour prior to dental procedures     aspirin 81 MG EC tablet  Take 81 mg by mouth daily.     carvedilol (COREG) 25 MG tablet Take 25 mg by mouth 2 (two) times daily with breakfast and lunch.     Cholecalciferol (VITAMIN D) 50 MCG (2000 UT) CAPS Take 2,000 Units by mouth daily.     Coenzyme Q10 (COQ10) 200 MG CAPS Take 200 mg by mouth 2 (two) times a day.     fluorouracil (EFUDEX) 5 % cream Apply topically 2 (two) times daily. Starting on September 12, 2022 Bid to scalp for 7 days, and then December 13, 2022 bid for 7 days to scalp 30 g 1   gabapentin (NEURONTIN) 300 MG capsule Take 300 mg by mouth at bedtime.     glucosamine-chondroitin 500-400 MG tablet Take 1 tablet by mouth 2 (two) times a day.      hydrocortisone 2.5 % cream Apply topically as directed. Qd to Bid to corners of mouth up to 5d/wk prn flares 30 g 4   latanoprost (XALATAN) 0.005 % ophthalmic solution Place 1 drop into both eyes in the morning.      magnesium gluconate (MAGONATE) 500 MG tablet Take 500 mg by mouth at bedtime.     Multiple Vitamin (MULTIVITAMIN) capsule Take 1 capsule by mouth daily.     mupirocin ointment (BACTROBAN) 2 % APPLY TO AFFECTED AREA TWICE A DAY 22 g 0   olmesartan (BENICAR) 20 MG tablet Take 20 mg by mouth daily.     oxyCODONE (OXY IR/ROXICODONE) 5 MG immediate release tablet Take 1-2 tablets (5-10 mg total) by mouth every 4 (four) hours as needed for moderate pain or severe pain (pain score 4-6). 50 tablet 0   pravastatin (PRAVACHOL) 40 MG tablet Take 40 mg by mouth at bedtime.     sertraline (ZOLOFT) 100 MG tablet Take 100 mg by mouth at bedtime.     sildenafil (REVATIO) 20 MG tablet Take 20 mg by mouth daily as needed (ED).      TRULICITY 0.75 MG/0.5ML SOPN Inject 0.75 mg into the skin every 7 (seven) days. Saturday     Zinc Methionate 50 MG CAPS Take 50 mg by mouth daily.      Pertinent medications related to GI and procedure were reviewed by me with the patient prior to the procedure   Current Facility-Administered Medications:    0.9 %  sodium chloride  infusion, , Intravenous, Continuous, Jaynie Collins, DO  sodium chloride         No Known Allergies Allergies were reviewed by me prior to the procedure  Objective   Body mass index is 31.9 kg/m. Vitals:   07/04/23 1001  BP: (!) 169/84  Pulse: (!) 57  Resp: 16  Temp: (!) 97 F (36.1 C)  TempSrc: Temporal  SpO2: 99%  Weight: 98 kg  Height: 5\' 9"  (1.753 m)     Physical Exam Vitals and nursing note reviewed.  Constitutional:      General: He is not in acute distress.    Appearance: Normal appearance. He is not ill-appearing, toxic-appearing or diaphoretic.  HENT:     Head:  Normocephalic and atraumatic.     Nose: Nose normal.     Mouth/Throat:     Mouth: Mucous membranes are moist.     Pharynx: Oropharynx is clear.  Eyes:     General: No scleral icterus.    Extraocular Movements: Extraocular movements intact.  Cardiovascular:     Rate and Rhythm: Regular rhythm. Bradycardia present.     Heart sounds: Normal heart sounds. No murmur heard.    No friction rub. No gallop.  Pulmonary:     Effort: Pulmonary effort is normal. No respiratory distress.     Breath sounds: Normal breath sounds. No wheezing, rhonchi or rales.  Abdominal:     General: Bowel sounds are normal. There is no distension.     Palpations: Abdomen is soft.     Tenderness: There is no abdominal tenderness. There is no guarding or rebound.  Musculoskeletal:     Cervical back: Neck supple.     Right lower leg: Edema present.     Left lower leg: Edema present.  Skin:    General: Skin is warm and dry.     Coloration: Skin is not jaundiced or pale.  Neurological:     General: No focal deficit present.     Mental Status: He is alert and oriented to person, place, and time. Mental status is at baseline.  Psychiatric:        Mood and Affect: Mood normal.        Behavior: Behavior normal.        Thought Content: Thought content normal.        Judgment: Judgment normal.      Assessment:  Mr.  Tobby K Patterson is a 72 y.o. male  who presents today for Colonoscopy for Colorectal cancer screening .  Plan:  Colonoscopy with possible intervention today  Colonoscopy with possible biopsy, control of bleeding, polypectomy, and interventions as necessary has been discussed with the patient/patient representative. Informed consent was obtained from the patient/patient representative after explaining the indication, nature, and risks of the procedure including but not limited to death, bleeding, perforation, missed neoplasm/lesions, cardiorespiratory compromise, and reaction to medications. Opportunity for questions was given and appropriate answers were provided. Patient/patient representative has verbalized understanding is amenable to undergoing the procedure.   Jaynie Collins, DO  [facility]  Portions of the record may have been created with voice recognition software. Occasional wrong-word or 'sound-a-like' substitutions may have occurred due to the inherent limitations of voice recognition software.  Read the chart carefully and recognize, using context, where substitutions may have occurred.

## 2023-07-04 NOTE — Anesthesia Preprocedure Evaluation (Signed)
Anesthesia Evaluation  Patient identified by MRN, date of birth, ID band Patient awake    Reviewed: Allergy & Precautions, NPO status , Patient's Chart, lab work & pertinent test results  History of Anesthesia Complications Negative for: history of anesthetic complications  Airway Mallampati: III  TM Distance: <3 FB Neck ROM: full    Dental  (+) Chipped   Pulmonary neg pulmonary ROS, neg shortness of breath   Pulmonary exam normal        Cardiovascular Exercise Tolerance: Good hypertension, (-) angina + Peripheral Vascular Disease  Normal cardiovascular exam     Neuro/Psych negative neurological ROS  negative psych ROS   GI/Hepatic negative GI ROS, Neg liver ROS,neg GERD  ,,  Endo/Other  diabetes, Type 2    Renal/GU Renal disease  negative genitourinary   Musculoskeletal   Abdominal   Peds  Hematology negative hematology ROS (+)   Anesthesia Other Findings Past Medical History: No date: Actinic keratosis No date: Arthritis 09/08/2020: Basal cell carcinoma     Comment:  left lower eyelid  07/14/2021: Basal cell carcinoma     Comment:  Left temple No date: Chronic kidney disease     Comment:  increase protein/ diabetes No date: Complication of anesthesia     Comment:  uvular trauma.  Required a uvulectomy No date: Diabetes mellitus (HCC) No date: Hypertension No date: Proteinuria No date: PVD (peripheral vascular disease) (HCC)     Comment:  vericosity(had surgery)  Past Surgical History: 02/08/2022: CATARACT EXTRACTION W/PHACO; Right     Comment:  Procedure: CATARACT EXTRACTION PHACO AND INTRAOCULAR               LENS PLACEMENT (IOC) RIGHT DIABETIC;  Surgeon: Nevada Crane, MD;  Location: [facility];                Service: Ophthalmology;  Laterality: Right;  8.14 0:48.6 02/22/2022: CATARACT EXTRACTION W/PHACO; Left     Comment:  Procedure: CATARACT EXTRACTION PHACO AND  INTRAOCULAR               LENS PLACEMENT (IOC) LEFT DIABETIC 6.73 00:42.6;                Surgeon: Nevada Crane, MD;  [facility]: [facility];  Service: Ophthalmology;  Laterality: Left;              Diabetic 05/07/2019: KNEE ARTHROPLASTY; Right     Comment:  Procedure: COMPUTER ASSISTED TOTAL KNEE ARTHROPLASTY               RIGHT;  Surgeon: Donato Heinz, MD;  Location: [facility]               ORS;  Service: Orthopedics;  Laterality: Right; No date: KNEE ARTHROSCOPY; Left No date: PILONIDAL CYST EXCISION No date: PILONIDAL CYST EXCISION     Comment:  x2 No date: ROTATOR CUFF REPAIR; Right 10/02/2020: SHOULDER ARTHROSCOPY WITH OPEN ROTATOR CUFF REPAIR; Right     Comment:  Procedure: RIGHT SHOULDER ARTHROSCOPY WITH DEBRIDEMENT,               DECOMPRESSION, REPAIR OF LARGE RECURRENT ROTATOR CUFF               TEAR, AND POSSIBLE BICEPS TENODESIS.;  Surgeon: Joice Lofts,  Excell Seltzer, MD;  Location: [facility] OR[facility];  Service: Orthopedics;                Laterality: Right; No date: TONSILLECTOMY No date: UVULECTOMY 1992: VARICOSE VEIN SURGERY; Left 1990: VASECTOMY No date: VEIN SURGERY     Comment:  laser  BMI    Body Mass Index: 31.90 kg/m      Reproductive/Obstetrics negative OB ROS                             Anesthesia Physical Anesthesia Plan  ASA: 2  Anesthesia Plan: General   Post-op Pain Management:    Induction: Intravenous  PONV Risk Score and Plan: Propofol infusion and TIVA  Airway Management Planned: Natural Airway and Nasal Cannula  Additional Equipment:   Intra-op Plan:   Post-operative Plan:   Informed Consent: I have reviewed the patients History and Physical, chart, labs and discussed the procedure including the risks, benefits and alternatives for the proposed anesthesia with the patient or authorized representative who has indicated his/her understanding and acceptance.     Dental Advisory  Given  Plan Discussed with: Anesthesiologist, CRNA and Surgeon  Anesthesia Plan Comments: (Patient consented for risks of anesthesia including but not limited to:  - adverse reactions to medications - risk of airway placement if required - damage to eyes, teeth, lips or other oral mucosa - nerve damage due to positioning  - sore throat or hoarseness - Damage to heart, brain, nerves, lungs, other parts of body or loss of life  Patient voiced understanding.)       Anesthesia Quick Evaluation

## 2023-07-04 NOTE — Anesthesia Procedure Notes (Signed)
Procedure Name: MAC Date/Time: 07/04/2023 10:47 AM  Performed by: Cheral Bay, CRNAPre-anesthesia Checklist: Patient identified, Emergency Drugs available, Suction available, Patient being monitored and Timeout performed Patient Re-evaluated:Patient Re-evaluated prior to induction Oxygen Delivery Method: Nasal cannula Induction Type: IV induction Placement Confirmation: positive ETCO2 and CO2 detector

## 2023-07-04 NOTE — Interval H&P Note (Signed)
History and Physical Interval Note: Preprocedure H&P from 07/04/23  was reviewed and there was no interval change after seeing and examining the patient.  Written consent was obtained from the patient after discussion of risks, benefits, and alternatives. Patient has consented to proceed with Colonoscopy with possible intervention   07/04/2023 10:10 AM  Lawrence Patterson  has presented today for surgery, with the diagnosis of Screening for colon cancer (Z12.11).  The various methods of treatment have been discussed with the patient and family. After consideration of risks, benefits and other options for treatment, the patient has consented to  Procedure(s): COLONOSCOPY (N/A) as a surgical intervention.  The patient's history has been reviewed, patient examined, no change in status, stable for surgery.  I have reviewed the patient's chart and labs.  Questions were answered to the patient's satisfaction.     Jaynie Collins

## 2023-07-04 NOTE — Anesthesia Postprocedure Evaluation (Signed)
Anesthesia Post Note  Patient: Lawrence Patterson  Procedure(s) Performed: COLONOSCOPY POLYPECTOMY  Patient location during evaluation: Endoscopy Anesthesia Type: General Level of consciousness: awake and alert Pain management: pain level controlled Vital Signs Assessment: post-procedure vital signs reviewed and stable Respiratory status: spontaneous breathing, nonlabored ventilation, respiratory function stable and patient connected to nasal cannula oxygen Cardiovascular status: blood pressure returned to baseline and stable Postop Assessment: no apparent nausea or vomiting Anesthetic complications: no   No notable events documented.   Last Vitals:  Vitals:   07/04/23 1136 07/04/23 1146  BP: (!) 154/82 (!) 155/80  Pulse: (!) 58 (!) 57  Resp: (!) 24 20  Temp:    SpO2: 99% 100%    Last Pain:  Vitals:   07/04/23 1126  TempSrc: Temporal  PainSc:                  Cleda Mccreedy Warren Kugelman

## 2023-07-04 NOTE — Op Note (Signed)
[facility] Gastroenterology Patient Name: Lawrence Patterson Procedure Date: 07/04/2023 10:38 AM MRN: [id number] Account #: [id number] Date of Birth: [birth year] Admit Type: Outpatient Age: 72 Room: [facility] ENDO ROOM 2 Gender: Male Note Status: Finalized Instrument Name: Colonoscope [id number] Procedure:             Colonoscopy Indications:           Screening for colorectal malignant neoplasm Providers:             Jaynie Collins DO, DO Medicines:             Monitored Anesthesia Care Complications:         No immediate complications. Estimated blood loss:                         Minimal. Procedure:             Pre-Anesthesia Assessment:                        - Prior to the procedure, a History and Physical was                         performed, and patient medications and allergies were                         reviewed. The patient is competent. The risks and                         benefits of the procedure and the sedation options and                         risks were discussed with the patient. All questions                         were answered and informed consent was obtained.                         Patient identification and proposed procedure were                         verified by the physician, the nurse, the anesthetist                         and the technician in the endoscopy suite. Mental                         Status Examination: alert and oriented. Airway                         Examination: normal oropharyngeal airway and neck                         mobility. Respiratory Examination: clear to                         auscultation. CV Examination: RRR, no murmurs, no S3                         or S4. Prophylactic Antibiotics: The patient does not  require prophylactic antibiotics. Prior                         Anticoagulants: The patient has taken no anticoagulant                         or antiplatelet agents. ASA Grade  Assessment: II - A                         patient with mild systemic disease. After reviewing                         the risks and benefits, the patient was deemed in                         satisfactory condition to undergo the procedure. The                         anesthesia plan was to use monitored anesthesia care                         (MAC). Immediately prior to administration of                         medications, the patient was re-assessed for adequacy                         to receive sedatives. The heart rate, respiratory                         rate, oxygen saturations, blood pressure, adequacy of                         pulmonary ventilation, and response to care were                         monitored throughout the procedure. The physical                         status of the patient was re-assessed after the                         procedure.                        After obtaining informed consent, the colonoscope was                         passed under direct vision. Throughout the procedure,                         the patient's blood pressure, pulse, and oxygen                         saturations were monitored continuously. The                         Colonoscope was introduced through the anus and  advanced to the the terminal ileum, with                         identification of the appendiceal orifice and IC                         valve. The colonoscopy was performed without                         difficulty. The patient tolerated the procedure well.                         The quality of the bowel preparation was evaluated                         using the BBPS [facility] Bowel Preparation Scale) with                         scores of: Right Colon = 2 (minor amount of residual                         staining, small fragments of stool and/or opaque                         liquid, but mucosa seen well), Transverse Colon = 3                          (entire mucosa seen well with no residual staining,                         small fragments of stool or opaque liquid) and Left                         Colon = 2 (minor amount of residual staining, small                         fragments of stool and/or opaque liquid, but mucosa                         seen well). The total BBPS score equals 7. The quality                         of the bowel preparation was good. The terminal ileum,                         ileocecal valve, appendiceal orifice, and rectum were                         photographed. Findings:      The perianal and digital rectal examinations were normal. Pertinent       negatives include normal sphincter tone.      Retroflexion in the right colon was performed.      The terminal ileum appeared normal. Estimated blood loss: none.      Non-bleeding internal hemorrhoids were found during retroflexion. The       hemorrhoids were Grade I (internal hemorrhoids that do not prolapse).  Estimated blood loss: none.      Three sessile polyps were found in the sigmoid colon (1), descending       colon (1) and transverse colon (1). The polyps were 3 to 5 mm in size.       These polyps were removed with a cold snare. Resection and retrieval       were complete. Estimated blood loss was minimal.      Seven sessile polyps were found in the rectum (2), descending colon (1),       transverse colon (2) and ascending colon (2). The polyps were 1 to 2 mm       in size. These polyps were removed with a jumbo cold forceps. Resection       and retrieval were complete. Estimated blood loss was minimal.      The exam was otherwise without abnormality on direct and retroflexion       views. Impression:            - The examined portion of the ileum was normal.                        - Non-bleeding internal hemorrhoids.                        - Three 3 to 5 mm polyps in the sigmoid colon, in the                         descending colon  and in the transverse colon, removed                         with a cold snare. Resected and retrieved.                        - Seven 1 to 2 mm polyps in the rectum, in the                         descending colon, in the transverse colon and in the                         ascending colon, removed with a jumbo cold forceps.                         Resected and retrieved.                        - The examination was otherwise normal on direct and                         retroflexion views. Recommendation:        - Patient has a contact number available for                         emergencies. The signs and symptoms of potential                         delayed complications were discussed with the patient.                         Return to  normal activities tomorrow. Written                         discharge instructions were provided to the patient.                        - Discharge patient to home.                        - Resume previous diet.                        - Continue present medications.                        - No ibuprofen, naproxen, or other non-steroidal                         anti-inflammatory drugs for 5 days after polyp removal.                        - Can resume Trulicity                        - Await pathology results.                        - Repeat colonoscopy for surveillance based on                         pathology results.                        - Return to referring physician as previously                         scheduled.                        - The findings and recommendations were discussed with                         the patient. Procedure Code(s):     --- Professional ---                        [phone], Colonoscopy, flexible; with removal of                         tumor(s), polyp(s), or other lesion(s) by snare                         technique                        45380, 59, Colonoscopy, flexible; with biopsy, single                         or  multiple Diagnosis Code(s):     --- Professional ---                        Z12.11, Encounter for screening for malignant neoplasm  of colon                        K64.0, First degree hemorrhoids                        D12.5, Benign neoplasm of sigmoid colon                        D12.8, Benign neoplasm of rectum                        D12.4, Benign neoplasm of descending colon                        D12.3, Benign neoplasm of transverse colon (hepatic                         flexure or splenic flexure)                        D12.2, Benign neoplasm of ascending colon CPT copyright 2022 American Medical Association. All rights reserved. The codes documented in this report are preliminary and upon coder review may  be revised to meet current compliance requirements. Attending Participation:      I personally performed the entire procedure. Elfredia Nevins, DO Jaynie Collins DO, DO 07/04/2023 11:27:42 AM This report has been signed electronically. Number of Addenda: 0 Note Initiated On: 07/04/2023 10:38 AM Scope Withdrawal Time: 0 hours 26 minutes 13 seconds  Total Procedure Duration: 0 hours 30 minutes 34 seconds  Estimated Blood Loss:  Estimated blood loss was minimal.      [facility]

## 2023-07-05 ENCOUNTER — Encounter: Payer: Self-pay | Admitting: Gastroenterology

## 2023-07-13 ENCOUNTER — Ambulatory Visit (INDEPENDENT_AMBULATORY_CARE_PROVIDER_SITE_OTHER): Payer: Medicare Other | Admitting: Dermatology

## 2023-07-13 VITALS — BP 148/80

## 2023-07-13 DIAGNOSIS — L570 Actinic keratosis: Secondary | ICD-10-CM | POA: Diagnosis not present

## 2023-07-13 DIAGNOSIS — D492 Neoplasm of unspecified behavior of bone, soft tissue, and skin: Secondary | ICD-10-CM

## 2023-07-13 DIAGNOSIS — Z5111 Encounter for antineoplastic chemotherapy: Secondary | ICD-10-CM

## 2023-07-13 DIAGNOSIS — C44319 Basal cell carcinoma of skin of other parts of face: Secondary | ICD-10-CM | POA: Diagnosis not present

## 2023-07-13 DIAGNOSIS — L821 Other seborrheic keratosis: Secondary | ICD-10-CM

## 2023-07-13 DIAGNOSIS — W908XXA Exposure to other nonionizing radiation, initial encounter: Secondary | ICD-10-CM

## 2023-07-13 DIAGNOSIS — L578 Other skin changes due to chronic exposure to nonionizing radiation: Secondary | ICD-10-CM

## 2023-07-13 DIAGNOSIS — C44310 Basal cell carcinoma of skin of unspecified parts of face: Secondary | ICD-10-CM

## 2023-07-13 DIAGNOSIS — Z7189 Other specified counseling: Secondary | ICD-10-CM

## 2023-07-13 NOTE — Patient Instructions (Addendum)
In the fall re - Start 5-fluorouracil/calcipotriene cream twice a day for 7-10 days to affected areas including scalp. Prescription was originally sent to [facility] and patient has script at home.  Patient provided with handout reviewing treatment course and side effects and advised to call or message [place] on MyChart with any concerns.   5-Fluorouracil/Calcipotriene Patient Education   Actinic keratoses are the dry, red scaly spots on the skin caused by sun damage. A portion of these spots can turn into skin cancer with time, and treating them can help prevent development of skin cancer.   Treatment of these spots requires removal of the defective skin cells. There are various ways to remove actinic keratoses, including freezing with liquid nitrogen, treatment with creams, or treatment with a blue light procedure in the office.   5-fluorouracil cream is a topical cream used to treat actinic keratoses. It works by interfering with the growth of abnormal fast-growing skin cells, such as actinic keratoses. These cells peel off and are replaced by healthy ones.   5-fluorouracil/calcipotriene is a combination of the 5-fluorouracil cream with a vitamin D analog cream called calcipotriene. The calcipotriene alone does not treat actinic keratoses. However, when it is combined with 5-fluorouracil, it helps the 5-fluorouracil treat the actinic keratoses much faster so that the same results can be achieved with a much shorter treatment time.  INSTRUCTIONS FOR 5-FLUOROURACIL/CALCIPOTRIENE CREAM:   5-fluorouracil/calcipotriene cream typically only needs to be used for 4-7 days. A thin layer should be applied twice a day to the treatment areas recommended by your physician.   If your physician prescribed you separate tubes of 5-fluourouracil and calcipotriene, apply a thin layer of 5-fluorouracil followed by a thin layer of calcipotriene.   Avoid contact with your eyes, nostrils, and  mouth. Do not use 5-fluorouracil/calcipotriene cream on infected or open wounds.   You will develop redness, irritation and some crusting at areas where you have pre-cancer damage/actinic keratoses. IF YOU DEVELOP PAIN, BLEEDING, OR SIGNIFICANT CRUSTING, STOP THE TREATMENT EARLY - you have already gotten a good response and the actinic keratoses should clear up well.  Wash your hands after applying 5-fluorouracil 5% cream on your skin.   A moisturizer or sunscreen with a minimum SPF 30 should be applied each morning.   Once you have finished the treatment, you can apply a thin layer of Vaseline twice a day to irritated areas to soothe and calm the areas more quickly. If you experience significant discomfort, contact your physician.  For some patients it is necessary to repeat the treatment for best results.  SIDE EFFECTS: When using 5-fluorouracil/calcipotriene cream, you may have mild irritation, such as redness, dryness, swelling, or a mild burning sensation. This usually resolves within 2 weeks. The more actinic keratoses you have, the more redness and inflammation you can expect during treatment. Eye irritation has been reported rarely. If this occurs, please let us know.  If you have any trouble using this cream, please call the office. If you have any other questions about this information, please do not hesitate to ask me before you leave the office.  Cryotherapy Aftercare  Wash gently with soap and water everyday.   Apply Vaseline and Band-Aid daily until healed.  Wound Care Instructions  Cleanse wound gently with soap and water once a day then pat dry with clean gauze. Apply a thin coat of Petrolatum (petroleum jelly, "Vaseline") over the wound (unless you have an allergy to this). We recommend that you use  a new, sterile tube of Vaseline. Do not pick or remove scabs. Do not remove the yellow or white "healing tissue" from the base of the wound.  Cover the wound with fresh, clean,  nonstick gauze and secure with paper tape. You may use Band-Aids in place of gauze and tape if the wound is small enough, but would recommend trimming much of the tape off as there is often too much. Sometimes Band-Aids can irritate the skin.  You should call the office for your biopsy report after 1 week if you have not already been contacted.  If you experience any problems, such as abnormal amounts of bleeding, swelling, significant bruising, significant pain, or evidence of infection, please call the office immediately.  FOR ADULT SURGERY PATIENTS: If you need something for pain relief you may take 1 extra strength Tylenol (acetaminophen) AND 2 Ibuprofen (200mg  each) together every 4 hours as needed for pain. (do not take these if you are allergic to them or if you have a reason you should not take them.) Typically, you may only need pain medication for 1 to 3 days.        Due to recent changes in healthcare laws, you may see results of your pathology and/or laboratory studies on MyChart before the doctors have had a chance to review them. We understand that in some cases there may be results that are confusing or concerning to you. Please understand that not all results are received at the same time and often the doctors may need to interpret multiple results in order to provide you with the best plan of care or course of treatment. Therefore, we ask that you please give [place] 2 business days to thoroughly review all your results before contacting the office for clarification. Should we see a critical lab result, you will be contacted sooner.   If You Need Anything After Your Visit  If you have any questions or concerns for your doctor, please call our main line at [phone] and press option 4 to reach your doctor's medical assistant. If no one answers, please leave a voicemail as directed and we will return your call as soon as possible. Messages left after 4 pm will be answered the following  business day.   You may also send [place] a message via MyChart. We typically respond to MyChart messages within 1-2 business days.  For prescription refills, please ask your pharmacy to contact our office. Our fax number is [phone].  If you have an urgent issue when the clinic is closed that cannot wait until the next business day, you can page your doctor at the number below.    Please note that while we do our best to be available for urgent issues outside of office hours, we are not available 24/7.   If you have an urgent issue and are unable to reach [place], you may choose to seek medical care at your doctor's office, retail clinic, urgent care center, or emergency room.  If you have a medical emergency, please immediately call 911 or go to the emergency department.  Pager Numbers  - Dr. Gwen Pounds: [phone]  - Dr. Roseanne Reno: [phone]  In the event of inclement weather, please call our main line at [phone] for an update on the status of any delays or closures.  Dermatology Medication Tips: Please keep the boxes that topical medications come in in order to help keep track of the instructions about where and how to use these. Pharmacies typically print the  medication instructions only on the boxes and not directly on the medication tubes.   If your medication is too expensive, please contact our office at [phone] option 4 or send [place] a message through MyChart.   We are unable to tell what your co-pay for medications will be in advance as this is different depending on your insurance coverage. However, we may be able to find a substitute medication at lower cost or fill out paperwork to get insurance to cover a needed medication.   If a prior authorization is required to get your medication covered by your insurance company, please allow [place] 1-2 business days to complete this process.  Drug prices often vary depending on where the prescription is filled and some pharmacies  may offer cheaper prices.  The website www.goodrx.com contains coupons for medications through different pharmacies. The prices here do not account for what the cost may be with help from insurance (it may be cheaper with your insurance), but the website can give you the price if you did not use any insurance.  - You can print the associated coupon and take it with your prescription to the pharmacy.  - You may also stop by our office during regular business hours and pick up a GoodRx coupon card.  - If you need your prescription sent electronically to a different pharmacy, notify our office through [facility] or by phone at [phone] option 4.     Si Usted Necesita Algo Despus de Su Visita  Tambin puede enviarnos un mensaje a travs de Clinical cytogeneticist. Por lo general respondemos a los mensajes de MyChart en el transcurso de 1 a 2 das hbiles.  Para renovar recetas, por favor pida a su farmacia que se ponga en contacto con nuestra oficina. Annie Sable de fax es [place] [phone].  Si tiene un asunto urgente cuando la clnica est cerrada y que no puede esperar hasta el siguiente da hbil, puede llamar/localizar a su doctor(a) al nmero que aparece a continuacin.   Por favor, tenga en cuenta que aunque hacemos todo lo posible para estar disponibles para asuntos urgentes fuera del horario de [place], no estamos disponibles las 24 horas del da, los 7 [address]  [address] de la [place].   Si tiene un problema urgente y no puede comunicarse con nosotros, puede optar por buscar atencin mdica  en el consultorio de su doctor(a), en una clnica privada, en un centro de atencin urgente o en una sala de emergencias.  Si tiene Engineer, drilling, por favor llame inmediatamente al 911 o vaya a la sala de emergencias.  Nmeros de bper  - Dr. Gwen Pounds: [phone]  - Dra. Moye: [phone]  - Dra. Roseanne Reno: [phone]  En caso de inclemencias del [place], por favor llame a Lacy Duverney principal  al [phone] para una actualizacin sobre el [place] de cualquier retraso o cierre.  Consejos para la medicacin en dermatologa: Por favor, guarde las cajas en las que vienen los medicamentos de uso tpico para ayudarle a seguir las instrucciones sobre dnde y cmo usarlos. Las farmacias generalmente imprimen las instrucciones del medicamento slo en las cajas y no directamente en los tubos del [place].   Si su medicamento es muy caro, por favor, pngase en contacto con Rolm Gala llamando al [phone] y presione la opcin 4 o envenos un mensaje a travs de Clinical cytogeneticist.   No podemos decirle cul ser su copago por los medicamentos por adelantado ya que esto es diferente dependiendo de la cobertura de su seguro. Sin  embargo, es posible que podamos encontrar un medicamento sustituto a Audiological scientist un formulario para que el seguro cubra el medicamento que se considera necesario.   Si se requiere una autorizacin previa para que su compaa de seguros [place] su medicamento, por favor permtanos de 1 a 2 das hbiles para completar [address]  Los precios de los medicamentos varan con frecuencia dependiendo del Environmental consultant de dnde se surte la receta y alguna farmacias pueden ofrecer precios ms baratos.  El sitio web www.goodrx.com tiene cupones para medicamentos de Health and safety inspector. Los precios aqu no tienen en cuenta lo que podra costar con la ayuda del seguro (puede ser ms barato con su seguro), pero el sitio web puede darle el precio si no utiliz Tourist information centre manager.  - Puede imprimir el cupn correspondiente y llevarlo con su receta a la farmacia.  - Tambin puede pasar por nuestra oficina durante el horario de atencin regular y Education officer, museum una tarjeta de cupones de GoodRx.  - Si necesita que su receta se enve electrnicamente a una farmacia diferente, informe a nuestra oficina a travs de MyChart de [place] o por telfono llamando al [phone] y presione la opcin 4.

## 2023-07-13 NOTE — Progress Notes (Signed)
Follow-Up Visit   Subjective  Lawrence Patterson is a 72 y.o. male who presents for the following: check spot L temple sore comes and goes, 64m, pt thinks has been txted with LN2 in past, hx of bleeding, check scaly spots scalp, check spot L thigh, 1 yr, no symptoms,   The patient has spots, moles and lesions to be evaluated, some may be new or changing and the patient may have concern these could be cancer.   The following portions of the chart were reviewed this encounter and updated as appropriate: medications, allergies, medical history  Review of Systems:  No other skin or systemic complaints except as noted in HPI or Assessment and Plan.  Objective  Well appearing patient in no apparent distress; mood and affect are within normal limits.   A focused examination was performed of the following areas: Face, scalp  Relevant exam findings are noted in the Assessment and Plan.  L temple 0.6 x 0.4cm pink pearly pap       Scalp x 12 (12) Pink scaly macules    Assessment & Plan   SEBORRHEIC KERATOSIS - Stuck-on, waxy, tan-brown papules including left thigh - Benign-appearing - Discussed benign etiology and prognosis. - Observe - Call for any changes   Neoplasm of skin L temple  Epidermal / dermal shaving  Lesion diameter (cm):  0.6 Informed consent: discussed and consent obtained   Patient was prepped and draped in usual sterile fashion: area prepped with alcohol. Anesthesia: the lesion was anesthetized in a standard fashion   Anesthetic:  1% lidocaine w/ epinephrine 1-100,000 buffered w/ 8.4% NaHCO3 Instrument used: flexible razor blade   Hemostasis achieved with: pressure, aluminum chloride and electrodesiccation   Outcome: patient tolerated procedure well    Destruction of lesion  Destruction method: electrodesiccation and curettage   Informed consent: discussed and consent obtained   Curettage performed in three different directions: Yes    Electrodesiccation performed over the curetted area: Yes   Final wound size (cm):  0.8 Hemostasis achieved with:  pressure, aluminum chloride and electrodesiccation Outcome: patient tolerated procedure well with no complications   Post-procedure details: wound care instructions given   Additional details:  Mupirocin ointment and Bandaid applied   Specimen 1 - Surgical pathology Differential Diagnosis: D48.5 R/O BCC  Check Margins: yes 0.6 x 0.4cm pink pearly pap EDC today  AK (actinic keratosis) (12) Scalp x 12  Actinic keratoses are precancerous spots that appear secondary to cumulative UV radiation exposure/sun exposure over time. They are chronic with expected duration over 1 year. A portion of actinic keratoses will progress to squamous cell carcinoma of the skin. It is not possible to reliably predict which spots will progress to skin cancer and so treatment is recommended to prevent development of skin cancer.  Recommend daily broad spectrum sunscreen SPF 30+ to sun-exposed areas, reapply every 2 hours as needed.  Recommend staying in the shade or wearing long sleeves, sun glasses (UVA+UVB protection) and wide brim hats (4-inch brim around the entire circumference of the hat). Call for new or changing lesions.  Destruction of lesion - Scalp x 12 (12)  Destruction method: cryotherapy   Informed consent: discussed and consent obtained   Lesion destroyed using liquid nitrogen: Yes   Region frozen until ice ball extended beyond lesion: Yes   Outcome: patient tolerated procedure well with no complications   Post-procedure details: wound care instructions given   Additional details:  Prior to procedure, discussed risks of blister formation,  small wound, skin dyspigmentation, or rare scar following cryotherapy. Recommend Vaseline ointment to treated areas while healing.    ACTINIC DAMAGE WITH PRECANCEROUS ACTINIC KERATOSES Counseling for Topical Chemotherapy Management: Patient  exhibits: - Severe, confluent actinic changes with pre-cancerous actinic keratoses that is secondary to cumulative UV radiation exposure over time - Condition that is severe; chronic, not at goal. - diffuse scaly erythematous macules and papules with underlying dyspigmentation - Discussed Prescription "Field Treatment" topical Chemotherapy for Severe, Chronic Confluent Actinic Changes with Pre-Cancerous Actinic Keratoses Field treatment involves treatment of an entire area of skin that has confluent Actinic Changes (Sun/ Ultraviolet light damage) and PreCancerous Actinic Keratoses by method of PhotoDynamic Therapy (PDT) and/or prescription Topical Chemotherapy agents such as 5-fluorouracil, 5-fluorouracil/calcipotriene, and/or imiquimod.  The purpose is to decrease the number of clinically evident and subclinical PreCancerous lesions to prevent progression to development of skin cancer by chemically destroying early precancer changes that may or may not be visible.  It has been shown to reduce the risk of developing skin cancer in the treated area. As a result of treatment, redness, scaling, crusting, and open sores may occur during treatment course. One or more than one of these methods may be used and may have to be used several times to control, suppress and eliminate the PreCancerous changes. Discussed treatment course, expected reaction, and possible side effects. - Recommend daily broad spectrum sunscreen SPF 30+ to sun-exposed areas, reapply every 2 hours as needed.  - Staying in the shade or wearing long sleeves, sun glasses (UVA+UVB protection) and wide brim hats (4-inch brim around the entire circumference of the hat) are also recommended. - Call for new or changing lesions.  - In the fall re - Start 5-fluorouracil/calcipotriene cream twice a day for 7-10 days to affected areas including scalp. Prescription was originally sent to [facility] and patient has script at home.   Patient provided with handout reviewing treatment course and side effects and advised to call or message [place] on MyChart with any concerns.  Reviewed course of treatment and expected reaction.  Patient advised to expect inflammation and crusting and advised that erosions are possible.  Patient advised to be diligent with sun protection during and after treatment. Counseled to keep medication out of reach of children and pets.    Return for as scheduled with Dr. Gwen Pounds for TBSE, Hx of BCC, Hx of AKs.  I, Ardis Rowan, RMA, am acting as scribe for Willeen Niece, MD .   Documentation: I have reviewed the above documentation for accuracy and completeness, and I agree with the above.  Willeen Niece, MD

## 2023-07-26 ENCOUNTER — Telehealth: Payer: Self-pay

## 2023-07-26 NOTE — Telephone Encounter (Signed)
Patient advised of BX results .aw 

## 2023-07-26 NOTE — Telephone Encounter (Signed)
-----   Message from Willeen Niece sent at 07/25/2023  6:56 PM EDT ----- Skin , L temple BASAL CELL CARCINOMA, NODULAR PATTERN, PERIPHERAL AND DEEP MARGINS INVOLVED  BCC skin cancer- already treated with EDC at time of biopsy   - please call patient

## 2023-07-26 NOTE — Telephone Encounter (Signed)
Left pt msg to call for bx results/sh 

## 2023-08-16 ENCOUNTER — Other Ambulatory Visit: Payer: Self-pay | Admitting: Internal Medicine

## 2023-08-16 DIAGNOSIS — N1832 Chronic kidney disease, stage 3b: Secondary | ICD-10-CM

## 2023-08-16 DIAGNOSIS — N179 Acute kidney failure, unspecified: Secondary | ICD-10-CM

## 2023-08-18 ENCOUNTER — Ambulatory Visit
Admission: RE | Admit: 2023-08-18 | Discharge: 2023-08-18 | Disposition: A | Discharge status: Home / Self Care | Payer: Medicare Other | Source: Ambulatory Visit | Attending: Internal Medicine | Admitting: Internal Medicine

## 2023-08-18 DIAGNOSIS — N1832 Chronic kidney disease, stage 3b: Secondary | ICD-10-CM | POA: Insufficient documentation

## 2023-08-18 DIAGNOSIS — N179 Acute kidney failure, unspecified: Secondary | ICD-10-CM | POA: Diagnosis present

## 2024-01-05 ENCOUNTER — Other Ambulatory Visit: Payer: Self-pay

## 2024-01-05 DIAGNOSIS — K130 Diseases of lips: Secondary | ICD-10-CM

## 2024-01-05 MED ORDER — HYDROCORTISONE 2.5 % EX CREA
TOPICAL_CREAM | CUTANEOUS | 4 refills | Status: AC
Start: 2024-01-05 — End: ?

## 2024-04-25 ENCOUNTER — Ambulatory Visit: Payer: Medicare Other | Admitting: Dermatology

## 2024-04-25 ENCOUNTER — Encounter: Payer: Self-pay | Admitting: Dermatology

## 2024-04-25 DIAGNOSIS — Z85828 Personal history of other malignant neoplasm of skin: Secondary | ICD-10-CM

## 2024-04-25 DIAGNOSIS — C44319 Basal cell carcinoma of skin of other parts of face: Secondary | ICD-10-CM | POA: Diagnosis not present

## 2024-04-25 DIAGNOSIS — R21 Rash and other nonspecific skin eruption: Secondary | ICD-10-CM

## 2024-04-25 DIAGNOSIS — Z1283 Encounter for screening for malignant neoplasm of skin: Secondary | ICD-10-CM | POA: Diagnosis not present

## 2024-04-25 DIAGNOSIS — D1801 Hemangioma of skin and subcutaneous tissue: Secondary | ICD-10-CM

## 2024-04-25 DIAGNOSIS — L570 Actinic keratosis: Secondary | ICD-10-CM

## 2024-04-25 DIAGNOSIS — C4491 Basal cell carcinoma of skin, unspecified: Secondary | ICD-10-CM

## 2024-04-25 DIAGNOSIS — Q828 Other specified congenital malformations of skin: Secondary | ICD-10-CM

## 2024-04-25 DIAGNOSIS — L578 Other skin changes due to chronic exposure to nonionizing radiation: Secondary | ICD-10-CM | POA: Diagnosis not present

## 2024-04-25 DIAGNOSIS — Z5111 Encounter for antineoplastic chemotherapy: Secondary | ICD-10-CM

## 2024-04-25 DIAGNOSIS — D229 Melanocytic nevi, unspecified: Secondary | ICD-10-CM

## 2024-04-25 DIAGNOSIS — Z7189 Other specified counseling: Secondary | ICD-10-CM

## 2024-04-25 DIAGNOSIS — D492 Neoplasm of unspecified behavior of bone, soft tissue, and skin: Secondary | ICD-10-CM

## 2024-04-25 DIAGNOSIS — Z79899 Other long term (current) drug therapy: Secondary | ICD-10-CM

## 2024-04-25 DIAGNOSIS — D485 Neoplasm of uncertain behavior of skin: Secondary | ICD-10-CM

## 2024-04-25 DIAGNOSIS — L237 Allergic contact dermatitis due to plants, except food: Secondary | ICD-10-CM

## 2024-04-25 DIAGNOSIS — L814 Other melanin hyperpigmentation: Secondary | ICD-10-CM

## 2024-04-25 DIAGNOSIS — L821 Other seborrheic keratosis: Secondary | ICD-10-CM

## 2024-04-25 DIAGNOSIS — W908XXA Exposure to other nonionizing radiation, initial encounter: Secondary | ICD-10-CM

## 2024-04-25 DIAGNOSIS — D692 Other nonthrombocytopenic purpura: Secondary | ICD-10-CM

## 2024-04-25 HISTORY — DX: Basal cell carcinoma of skin, unspecified: C44.91

## 2024-04-25 MED ORDER — CLOBETASOL PROPIONATE 0.05 % EX CREA: TOPICAL_CREAM | CUTANEOUS | 0 refills | Status: DC

## 2024-04-25 MED ORDER — FLUOROURACIL 5 % EX CREA: TOPICAL_CREAM | CUTANEOUS | 0 refills | Status: AC

## 2024-04-25 NOTE — Patient Instructions (Addendum)

## 2024-04-25 NOTE — Progress Notes (Signed)
 Follow-Up Visit   Subjective  Lawrence Patterson is a 73 y.o. male who presents for the following: Skin Cancer Screening and Full Body Skin Exam  The patient presents for Total-Body Skin Exam (TBSE) for skin cancer screening and mole check. The patient has spots, moles and lesions to be evaluated, some may be new or changing and the patient may have concern these could be cancer.  The following portions of the chart were reviewed this encounter and updated as appropriate: medications, allergies, medical history  Review of Systems:  No other skin or systemic complaints except as noted in HPI or Assessment and Plan.  Objective  Well appearing patient in no apparent distress; mood and affect are within normal limits.  A full examination was performed including scalp, head, eyes, ears, nose, lips, neck, chest, axillae, abdomen, back, buttocks, bilateral upper extremities, bilateral lower extremities, hands, feet, fingers, toes, fingernails, and toenails. All findings within normal limits unless otherwise noted below.   Relevant physical exam findings are noted in the Assessment and Plan.  L temple 1.5 x 1.0 cm pearly pink patch  Scalp x 19, R temple x 1 (20) Erythematous thin papules/macules with gritty scale.   Assessment & Plan   SKIN CANCER SCREENING PERFORMED TODAY.  ACTINIC DAMAGE WITH PRECANCEROUS ACTINIC KERATOSES Counseling for Topical Chemotherapy Management: Patient exhibits: - Severe, confluent actinic changes with pre-cancerous actinic keratoses that is secondary to cumulative UV radiation exposure over time - Condition that is severe; chronic, not at goal. - diffuse scaly erythematous macules and papules with underlying dyspigmentation - Discussed Prescription "Field Treatment" topical Chemotherapy for Severe, Chronic Confluent Actinic Changes with Pre-Cancerous Actinic Keratoses Field treatment involves treatment of an entire area of skin that has confluent Actinic Changes  (Sun/ Ultraviolet light damage) and PreCancerous Actinic Keratoses by method of PhotoDynamic Therapy (PDT) and/or prescription Topical Chemotherapy agents such as 5-fluorouracil , 5-fluorouracil /calcipotriene, and/or imiquimod.  The purpose is to decrease the number of clinically evident and subclinical PreCancerous lesions to prevent progression to development of skin cancer by chemically destroying early precancer changes that may or may not be visible.  It has been shown to reduce the risk of developing skin cancer in the treated area. As a result of treatment, redness, scaling, crusting, and open sores may occur during treatment course. One or more than one of these methods may be used and may have to be used several times to control, suppress and eliminate the PreCancerous changes. Discussed treatment course, expected reaction, and possible side effects. - Recommend daily broad spectrum sunscreen SPF 30+ to sun-exposed areas, reapply every 2 hours as needed.  - Staying in the shade or wearing long sleeves, sun glasses (UVA+UVB protection) and wide brim hats (4-inch brim around the entire circumference of the hat) are also recommended. - Call for new or changing lesions. - Start 5FU/Calcipotriene mix BID x 10 days to the top of the scalp after July 4th.  LENTIGINES, SEBORRHEIC KERATOSES, HEMANGIOMAS - Benign normal skin lesions - Benign-appearing - Call for any changes  MELANOCYTIC NEVI - Tan-brown and/or pink-flesh-colored symmetric macules and papules - Benign appearing on exam today - Observation - Call clinic for new or changing moles - Recommend daily use of broad spectrum spf 30+ sunscreen to sun-exposed areas.   HISTORY OF BASAL CELL CARCINOMA OF THE SKIN - No evidence of recurrence today - Recommend regular full body skin exams - Recommend daily broad spectrum sunscreen SPF 30+ to sun-exposed areas, reapply every 2 hours as needed.  -  Call if any new or changing lesions are noted  between office visits  Porokeratosis on the R med pretibial 1.4 x 1.5 cm -     Benign-appearing.  Observation.  Call clinic for new or changing lesions.  Recommend daily use of broad spectrum spf 30+ sunscreen to sun-exposed areas.   Rash Contact Dermatitis Exam: R med ankle crusted ulcerations Differential diagnosis:  Contact dermatitis to poison ivy/oak Treatment Plan: Start Clobetasol 0.05% cream BID. Topical steroids (such as triamcinolone, fluocinolone, fluocinonide, mometasone, clobetasol, halobetasol, betamethasone, hydrocortisone ) can cause thinning and lightening of the skin if they are used for too long in the same area. Your physician has selected the right strength medicine for your problem and area affected on the body. Please use your medication only as directed by your physician to prevent side effects.   Sample given of Sernivo spray x 2 - apply to aa BID. Area cleaned with Puracyn spray today and Sernivo applied to aa, covered with a non-stick gauze and wrapped in Coban.    NEOPLASM OF UNCERTAIN BEHAVIOR OF SKIN L temple Skin / nail biopsy Type of biopsy: tangential   Informed consent: discussed and consent obtained   Timeout: patient name, date of birth, surgical site, and procedure verified   Procedure prep:  Patient was prepped and draped in usual sterile fashion Prep type:  Isopropyl alcohol Anesthesia: the lesion was anesthetized in a standard fashion   Anesthetic:  1% lidocaine  w/ epinephrine  1-100,000 buffered w/ 8.4% NaHCO3 Instrument used: flexible razor blade   Hemostasis achieved with: pressure, aluminum chloride and electrodesiccation   Outcome: patient tolerated procedure well   Post-procedure details: sterile dressing applied and wound care instructions given   Dressing type: bandage (Mupirocin  2% ointment)   Specimen 1 - Surgical pathology Differential Diagnosis: D48.5 r/o recurrent BCC [phone] Check Margins: No If positive for recurrent BCC  recommend Mohs AK (ACTINIC KERATOSIS) (20) Scalp x 19, R temple x 1 (20) Destruction of lesion - Scalp x 19, R temple x 1 (20) Complexity: simple   Destruction method: cryotherapy   Informed consent: discussed and consent obtained   Timeout:  patient name, date of birth, surgical site, and procedure verified Lesion destroyed using liquid nitrogen: Yes   Region frozen until ice ball extended beyond lesion: Yes   Outcome: patient tolerated procedure well with no complications   Post-procedure details: wound care instructions given    Purpura - Chronic; persistent and recurrent.  Treatable, but not curable. - Violaceous macules and patches - Benign - Related to trauma, age, sun damage and/or use of blood thinners, chronic use of topical and/or oral steroids - Observe - Can use OTC arnica containing moisturizer such as Dermend Bruise Formula if desired - Call for worsening or other concerns'  Return in about 1 year (around 04/25/2025) for TBSE.  Arlinda Lais, CMA, am acting as scribe for Celine Collard, MD .   Documentation: I have reviewed the above documentation for accuracy and completeness, and I agree with the above.  Celine Collard, MD

## 2024-04-27 LAB — SURGICAL PATHOLOGY

## 2024-04-30 ENCOUNTER — Ambulatory Visit: Payer: Self-pay | Admitting: Dermatology

## 2024-04-30 DIAGNOSIS — C44319 Basal cell carcinoma of skin of other parts of face: Secondary | ICD-10-CM

## 2024-05-01 ENCOUNTER — Encounter: Payer: Self-pay | Admitting: Dermatology

## 2024-05-01 NOTE — Telephone Encounter (Signed)
 Patient advised and [facility] referral sent to Dr. Paci

## 2024-05-01 NOTE — Addendum Note (Signed)
 Addended by: Lisbeth Rides R on: 05/01/2024 04:36 PM   Modules accepted: Orders

## 2024-05-01 NOTE — Telephone Encounter (Addendum)
  Tried calling patient regarding results and to send referral for Mohs if he prefers Dr. Fain Home. No answer. LM for patient to return call ----- Message from Celine Collard sent at 04/30/2024  5:49 PM EDT ----- FINAL DIAGNOSIS        1. Skin, L temple :       BASAL CELL CARCINOMA, SUPERFICIAL AND NODULAR PATTERNS   Cancer = BCC Schedule for [facility] (Dr Fain Home?)

## 2024-05-17 ENCOUNTER — Encounter: Payer: Self-pay | Admitting: Dermatology

## 2024-05-23 ENCOUNTER — Encounter: Payer: Self-pay | Admitting: Dermatology

## 2024-05-23 ENCOUNTER — Ambulatory Visit (INDEPENDENT_AMBULATORY_CARE_PROVIDER_SITE_OTHER): Admitting: Dermatology

## 2024-05-23 VITALS — BP 180/87 | HR 65 | Temp 98.4°F

## 2024-05-23 DIAGNOSIS — L579 Skin changes due to chronic exposure to nonionizing radiation, unspecified: Secondary | ICD-10-CM | POA: Diagnosis not present

## 2024-05-23 DIAGNOSIS — C44319 Basal cell carcinoma of skin of other parts of face: Secondary | ICD-10-CM

## 2024-05-23 DIAGNOSIS — L814 Other melanin hyperpigmentation: Secondary | ICD-10-CM | POA: Diagnosis not present

## 2024-05-23 DIAGNOSIS — C4491 Basal cell carcinoma of skin, unspecified: Secondary | ICD-10-CM | POA: Insufficient documentation

## 2024-05-23 NOTE — Patient Instructions (Signed)
 Important Information  Due to recent changes in healthcare laws, you may see results of your pathology and/or laboratory studies on MyChart before the doctors have had a chance to review them. We understand that in some cases there may be results that are confusing or concerning to you. Please understand that not all results are received at the same time and often the doctors may need to interpret multiple results in order to provide you with the best plan of care or course of treatment. Therefore, we ask that you please give [place] 2 business days to thoroughly review all your results before contacting the office for clarification. Should we see a critical lab result, you will be contacted sooner.   If You Need Anything After Your Visit  If you have any questions or concerns for your doctor, please call our main line at [phone] If no one answers, please leave a voicemail as directed and we will return your call as soon as possible. Messages left after 4 pm will be answered the following business day.   You may also send [place] a message via MyChart. We typically respond to MyChart messages within 1-2 business days.  For prescription refills, please ask your pharmacy to contact our office. Our fax number is [phone].  If you have an urgent issue when the clinic is closed that cannot wait until the next business day, you can page your doctor at the number below.    Please note that while we do our best to be available for urgent issues outside of office hours, we are not available 24/7.   If you have an urgent issue and are unable to reach [place], you may choose to seek medical care at your doctor's office, retail clinic, urgent care center, or emergency room.  If you have a medical emergency, please immediately call 911 or go to the emergency department. In the event of inclement weather, please call our main line at [phone] for an update on the status of any delays or  closures.  Dermatology Medication Tips: Please keep the boxes that topical medications come in in order to help keep track of the instructions about where and how to use these. Pharmacies typically print the medication instructions only on the boxes and not directly on the medication tubes.   If your medication is too expensive, please contact our office at [phone] or send [place] a message through MyChart.   We are unable to tell what your co-pay for medications will be in advance as this is different depending on your insurance coverage. However, we may be able to find a substitute medication at lower cost or fill out paperwork to get insurance to cover a needed medication.   If a prior authorization is required to get your medication covered by your insurance company, please allow [place] 1-2 business days to complete this process.  Drug prices often vary depending on where the prescription is filled and some pharmacies may offer cheaper prices.  The website www.goodrx.com contains coupons for medications through different pharmacies. The prices here do not account for what the cost may be with help from insurance (it may be cheaper with your insurance), but the website can give you the price if you did not use any insurance.  - You can print the associated coupon and take it with your prescription to the pharmacy.  - You may also stop by our office during regular business hours and pick up a GoodRx coupon card.  - If  you need your prescription sent electronically to a different pharmacy, notify our office through [facility] or by phone at [phone]    Wound Care Instructions for After Surgery  On the day following your surgery, you should begin doing daily dressing changes until your sutures are removed: Remove the bandage. Cleanse the wound gently with soap and water.  Make sure you then dry the skin surrounding the wound completely or the tape will not stick to the skin. Do not  use cotton balls on the wound. After the wound is clean and dry, apply the ointment (either prescription antibiotic prescribed by your doctor or plain Vaseline if nothing was prescribed) gently with a Q-tip. If you are using a bandaid to cover: Apply a bandaid large enough to cover the entire wound. If you do not have a bandaid large enough to cover the wound OR if you are sensitive to bandaid adhesive: Cut a non-stick pad (such as Telfa) to fit the size of the wound.  Cover the wound with the non-stick pad. If the wound is draining, you may want to add a small amount of gauze on top of the non-stick pad for a little added compression to the area. Use tape to seal the area completely.  For the next 1-2 weeks: Be sure to keep the wound moist with ointment 24/7 to ensure best healing. If you are unable to cover the wound with a bandage to hold the ointment in place, you may need to reapply the ointment several times a day. Do not bend over or lift heavy items to reduce the chance of elevated blood pressure to the wound. Do not participate in particularly strenuous activities.  Below is a list of dressing supplies you might need.  Cotton-tipped applicators - Q-tips Gauze pads (2x2 and/or 4x4) - All-Purpose Sponges New and clean tube of petroleum jelly (Vaseline) OR prescription antibiotic ointment if prescribed Either a bandaid large enough to cover the entire wound OR non-stick dressing material (Telfa) and Tape (Paper or Hypafix)  FOR ADULT SURGERY PATIENTS: If you need something for pain relief, you may take 1 extra strength Tylenol (acetaminophen) and 2 ibuprofen (200 mg) together every 4 hours as needed. (Do not take these medications if you are allergic to them or if you know you cannot take them for any other reason). Typically you may only need pain medication for 1-3 days.   Comments on the Post-Operative Period Slight swelling and redness often appear around the wound. This is normal  and will disappear within several days following the surgery. The healing wound will drain a brownish-red-yellow discharge during healing. This is a normal phase of wound healing. As the wound begins to heal, the drainage may increase in amount. Again, this drainage is normal. Notify us if the drainage becomes persistently bloody, excessively swollen, or intensely painful or develops a foul odor or red streaks.  The healing wound will also typically be itchy. This is normal. If you have severe or persistent pain, Notify us if the discomfort is severe or persistent. Avoid alcoholic beverages when taking pain medicine.  In Case of Wound Hemorrhage A wound hemorrhage is when the bandage suddenly becomes soaked with bright red blood and flows profusely. If this happens, sit down or lie down with your head elevated. If the wound has a dressing on it, do not remove the dressing. Apply pressure to the existing gauze. If the wound is not covered, use a gauze pad to apply pressure and  continue applying the pressure for 20 minutes without peeking. DO NOT COVER THE WOUND WITH A LARGE TOWEL OR WASH CLOTH. Release your hand from the wound site but do not remove the dressing. If the bleeding has stopped, gently clean around the wound. Leave the dressing in place for 24 hours if possible. This wait time allows the blood vessels to close off so that you do not spark a new round of bleeding by disrupting the newly clotted blood vessels with an immediate dressing change. If the bleeding does not subside, continue to hold pressure for 40 minutes. If bleeding continues, page your physician, contact an After Hours clinic or go to the Emergency Room.

## 2024-05-23 NOTE — Progress Notes (Signed)
 Follow-Up Visit   Subjective  Lawrence Patterson is a 73 y.o. 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 Bary Likes.   The following portions of the chart were reviewed this encounter and updated as appropriate: medications, allergies, medical history  Review of Systems:  No other skin or systemic complaints except as noted in HPI or Assessment and Plan.  Objective  Well appearing patient in no apparent distress; mood and affect are within normal limits.  A focused examination was performed of the following areas: Left temple Relevant physical exam findings are noted in the Assessment and Plan.   Left Temple Healing Biopsy site   Assessment & Plan   BASAL CELL CARCINOMA (BCC), UNSPECIFIED SITE Left Temple Mohs surgery  Consent obtained: written  Anticoagulation: Is the patient taking prescription anticoagulant and/or aspirin prescribed/recommended by a physician? Yes   Was the anticoagulation regimen changed prior to Mohs? No    Anesthesia: Anesthesia method: local infiltration Local anesthetic: lidocaine  1% WITH epi  Procedure Details: Timeout: pre-procedure verification complete Procedure Prep: patient was prepped and draped in usual sterile fashion Prep type: chlorhexidine  Biopsy accession number: [id number] Biopsy lab: [id number] Date of biopsy: 04/25/2024 Frozen section biopsy performed: No   Specimen debulked: No   Pre-Op diagnosis: basal cell carcinoma BCC subtype: nodular and superficial MohsAIQ Surgical site (if tumor spans multiple areas, please select predominant area): temple Surgery side: left Surgical site (from skin exam): Left Temple Pre-operative length (cm): 1.2 Pre-operative width (cm): 0.8 Indications for Mohs surgery: anatomic location where tissue conservation is critical Previously treated? No    Micrographic Surgery Details: Post-operative length (cm):  2.6 Post-operative width (cm): 2.1 Number of Mohs stages: 2 Is this a complex case (associate members only): No    Stage 1    Tumor features identified on Mohs section: basal carcinoma    Depth of defect after stage: subcutaneous fat    Perineural invasion: no perineural invasion  Stage 2    Tumor features identified on Mohs section: no tumor identified    Depth of defect after stage: subcutaneous fat    Perineural invasion: no perineural invasion  Patient tolerance of procedure: tolerated well, no immediate complications  Reconstruction: Was the defect reconstructed? Yes   Was reconstruction performed by the same Mohs surgeon? Yes   Setting of reconstruction: outpatient office When was reconstruction performed? same day Type of reconstruction: linear Linear reconstruction: complex  Opioids: Did the patient receive a prescription for opioid/narcotic related to Mohs surgery?: No    Antibiotics: Does patient meet AHA guidelines for endocarditis?: No   Does patient meet AHA guidelines for orthopedic prophylaxis?: No   Were antibiotics given on the day of surgery?: No   Did surgery breach mucosa, expose cartilage/bone, involve an area of lymphedema/inflamed/infected tissue? No    Skin repair Complexity:  Complex Final length (cm):  6 Informed consent: discussed and consent obtained   Timeout: patient name, date of birth, surgical site, and procedure verified   Procedure prep:  Patient was prepped and draped in usual sterile fashion Prep type:  Chlorhexidine  Anesthesia: the lesion was anesthetized in a standard fashion   Anesthetic:  1% lidocaine  w/ epinephrine  1-100,000 buffered w/ 8.4% NaHCO3 Reason for type of repair: reduce tension to allow closure and preserve normal anatomical and functional relationships   Undermining: area extensively undermined   Subcutaneous layers (deep stitches):  Suture size:  5-0 Suture type:  Monocryl (poliglecaprone 25)   Stitches:  Buried  vertical mattress Fine/surface layer approximation (top stitches):  Suture size:  6-0 Suture type: fast-absorbing plain gut   Stitches: simple running   Hemostasis achieved with: electrodesiccation Outcome: patient tolerated procedure well with no complications   Post-procedure details: sterile dressing applied and wound care instructions given   Dressing type: pressure dressing and petrolatum     Return in about 4 weeks (around 06/20/2024).  Maurine Sovereign, RN, am acting as scribe for Deneise Finlay, MD .   05/23/2024  HISTORY OF PRESENT ILLNESS  Lawrence Patterson is seen in consultation at the request of Dr. Bary Likes for biopsy-proven Superficial and Nodular Basal Cell Carcinoma of the left temple. They note that the area has been present for about 6 months increasing in size with time.  There is no history of previous treatment.  Reports no other new or changing lesions and has no other complaints today.  Medications and allergies: see patient chart.  Review of systems: Reviewed 8 systems and notable for the above skin cancer.  All other systems reviewed are unremarkable/negative, unless noted in the HPI. Past medical history, surgical history, family history, social history were also reviewed and are noted in the chart/questionnaire.    PHYSICAL EXAMINATION  General: Well-appearing, in no acute distress, alert and oriented x 4. Vitals reviewed in chart (if available).   Skin: Exam reveals a 1.2 x 0.8 cm erythematous papule and biopsy scar on the left temple. There are rhytids, telangiectasias, and lentigines, consistent with photodamage.  Biopsy report(s) reviewed, confirming the diagnosis.   ASSESSMENT  1) Superficial and Nodular Basal Cell Carcinoma of the left temple 2) photodamage 3) solar lentigines   PLAN   1. Due to location, size, histology, or recurrence and the likelihood of subclinical extension as well as the need to conserve normal surrounding tissue, the  patient was deemed acceptable for Mohs micrographic surgery (MMS).  The nature and purpose of the procedure, associated benefits and risks including recurrence and scarring, possible complications such as pain, infection, and bleeding, and alternative methods of treatment if appropriate were discussed with the patient during consent. The lesion location was verified by the patient, by reviewing previous notes, pathology reports, and by photographs as well as angulation measurements if available.  Informed consent was reviewed and signed by the patient, and timeout was performed at 9:30 AM. See op note below.  2. For the photodamage and solar lentigines, sun protection discussed/information given on OTC sunscreens, and we recommend continued regular follow-up with primary dermatologist every 6 months or sooner for any growing, bleeding, or changing lesions. 3. Prognosis and future surveillance discussed. 4. Letter with treatment outcome sent to referring provider. 5. Pain acetaminophen /ibuprofen  MOHS MICROGRAPHIC SURGERY AND RECONSTRUCTION  Initial size:   1.2 x 0.8 cm Surgical defect/wound size: 2.6 x 2.1 cm Anesthesia:    0.33% lidocaine  with 1:200,000 epinephrine  EBL:    <5 mL Complications:  None Repair type:   Complex SQ suture:   5-0 Monocryl Cutaneous suture:  6-0 Plain gut Final size of the repair: 6.0 cm  Stages: 2  STAGE I: Anesthesia achieved with 0.5% lidocaine  with 1:200,000 epinephrine . ChloraPrep applied. 1 section(s) excised using Mohs technique (this includes total peripheral and deep tissue margin excision and evaluation with frozen sections, excised and interpreted by the same physician). The tumor was first debulked and then excised with an approx. 2 mm margin.  Hemostasis was achieved with electrocautery as needed.  The specimen was then oriented, subdivided/relaxed, inked, and processed using Mohs technique.    Frozen section analysis revealed a positive margin for   multiple, small buds of basaloid cells descending from the epidermis with no dermal invasion in the peripheral margin.    STAGE II: An additional 2 mm margin was excised.  Hemostasis was achieved with electrocautery as needed.  The specimen was then oriented, subdivided/relaxed, inked, and processed using Mohs technique. Evaluation of slides by the Mohs surgeon revealed clear tumor margins.  Reconstruction  The surgical wound was then cleaned, prepped, and re-anesthetized as above. Wound edges were undermined extensively along at least one entire edge and at a distance equal to or greater than the width of the defect (see wound defect size above) in order to achieve closure and decrease wound tension and anatomic distortion. Redundant tissue repair including standing cone removal was performed. Hemostasis was achieved with electrocautery. Subcutaneous and epidermal tissues were approximated with the above sutures. The surgical site was then lightly scrubbed with sterile, saline-soaked gauze. The area was then bandaged using Vaseline ointment, non-adherent gauze, gauze pads, and tape to provide an adequate pressure dressing. The patient tolerated the procedure well, was given detailed written and verbal wound care instructions, and was discharged in good condition.   The patient will follow-up: 4 weeks.   Documentation: I have reviewed the above documentation for accuracy and completeness, and I agree with the above.  Deneise Finlay, MD

## 2024-05-30 ENCOUNTER — Encounter: Payer: Self-pay | Admitting: Dermatology

## 2024-07-02 ENCOUNTER — Encounter: Payer: Self-pay | Admitting: Dermatology

## 2024-07-02 ENCOUNTER — Ambulatory Visit: Admitting: Dermatology

## 2024-07-02 VITALS — BP 172/85 | HR 67

## 2024-07-02 DIAGNOSIS — Z85828 Personal history of other malignant neoplasm of skin: Secondary | ICD-10-CM

## 2024-07-02 DIAGNOSIS — L905 Scar conditions and fibrosis of skin: Secondary | ICD-10-CM

## 2024-07-02 DIAGNOSIS — C4491 Basal cell carcinoma of skin, unspecified: Secondary | ICD-10-CM

## 2024-07-02 DIAGNOSIS — T8131XA Disruption of external operation (surgical) wound, not elsewhere classified, initial encounter: Secondary | ICD-10-CM | POA: Diagnosis not present

## 2024-07-02 NOTE — Progress Notes (Signed)
   Follow Up Visit   Subjective  Lawrence Patterson is a 73 y.o. male who presents for the following: follow up from Mohs surgery   The patient presents for follow up from Mohs surgery for a [facility] on the left temple, treated on 05/23/2024, repaired with a complex linear repair. The patient has been bandaging the wound as directed. The endorse the following concerns: scabbing at the top of the suture line. He believes it may be a suture remaining.  The following portions of the chart were reviewed this encounter and updated as appropriate: medications, allergies, medical history  Review of Systems:  No other skin or systemic complaints except as noted in HPI or Assessment and Plan.  Objective  Well appearing patient in no apparent distress; mood and affect are within normal limits.  A full examination was performed including the face. All findings within normal limits unless otherwise noted below.  Healing wound with mild erythema  Relevant physical exam findings are noted in the Assessment and Plan.    Assessment & Plan   Scar s/p Mohs for [facility] on the left temple, treated on 05/23/2024, repaired with a complex linear closure - Reassured that wound is healing well - No evidence of infection - Spitting stitch at superior suture line removed. - No swelling, induration, purulence, dehiscence, or tenderness out of proportion to the clinical exam, see photo above - Discussed that scars take up to 12 months to mature from the date of surgery - Recommend SPF 30+ to scar daily to prevent purple color from UV exposure during scar maturation process - Discussed that erythema and raised appearance of scar will fade over the next 4-6 months - OK to start scar massage at 4-6 weeks post-op - Can consider silicone based products for scar healing starting at 6 weeks post-op - Ok to discontinue ointment daily - Advised to apply warm compress if there are any additional sutures that come out of the  skin  HISTORY OF BASAL CELL CARCINOMA OF THE SKIN - No evidence of recurrence today - Recommend regular full body skin exams - Recommend daily broad spectrum sunscreen SPF 30+ to sun-exposed areas, reapply every 2 hours as needed.  - Call if any new or changing lesions are noted between office visits    Return if symptoms worsen or fail to improve.  LILLETTE Rollene Gobble, RN, am acting as scribe for RUFUS CHRISTELLA HOLY, MD .   Documentation: I have reviewed the above documentation for accuracy and completeness, and I agree with the above.  RUFUS CHRISTELLA HOLY, MD

## 2024-07-02 NOTE — Patient Instructions (Signed)

## 2025-01-23 ENCOUNTER — Ambulatory Visit: Admitting: Dermatology
